# Patient Record
Sex: Female | Born: 1965
Health system: Southern US, Community
[De-identification: ages and names within clinical notes are randomized; demographics above are authoritative.]

## PROBLEM LIST (undated history)

## (undated) DIAGNOSIS — E119 Type 2 diabetes mellitus without complications: Secondary | ICD-10-CM

## (undated) DIAGNOSIS — C801 Malignant (primary) neoplasm, unspecified: Secondary | ICD-10-CM

## (undated) DIAGNOSIS — F419 Anxiety disorder, unspecified: Secondary | ICD-10-CM

## (undated) DIAGNOSIS — K219 Gastro-esophageal reflux disease without esophagitis: Secondary | ICD-10-CM

## (undated) DIAGNOSIS — E079 Disorder of thyroid, unspecified: Secondary | ICD-10-CM

## (undated) DIAGNOSIS — B009 Herpesviral infection, unspecified: Secondary | ICD-10-CM

## (undated) DIAGNOSIS — J45909 Unspecified asthma, uncomplicated: Secondary | ICD-10-CM

## (undated) DIAGNOSIS — F32A Depression, unspecified: Secondary | ICD-10-CM

## (undated) DIAGNOSIS — K635 Polyp of colon: Secondary | ICD-10-CM

## (undated) DIAGNOSIS — G4733 Obstructive sleep apnea (adult) (pediatric): Secondary | ICD-10-CM

## (undated) DIAGNOSIS — E785 Hyperlipidemia, unspecified: Secondary | ICD-10-CM

## (undated) DIAGNOSIS — Z9989 Dependence on other enabling machines and devices: Secondary | ICD-10-CM

## (undated) DIAGNOSIS — F329 Major depressive disorder, single episode, unspecified: Secondary | ICD-10-CM

## (undated) DIAGNOSIS — I10 Essential (primary) hypertension: Secondary | ICD-10-CM

## (undated) HISTORY — PX: SALIVARY GLAND SURGERY: SHX768

## (undated) HISTORY — DX: Dependence on other enabling machines and devices: Z99.89

## (undated) HISTORY — DX: Essential (primary) hypertension: I10

## (undated) HISTORY — DX: Disorder of thyroid, unspecified: E07.9

## (undated) HISTORY — DX: Type 2 diabetes mellitus without complications: E11.9

## (undated) HISTORY — DX: Herpesviral infection, unspecified: B00.9

## (undated) HISTORY — DX: Hyperlipidemia, unspecified: E78.5

## (undated) HISTORY — DX: Gastro-esophageal reflux disease without esophagitis: K21.9

## (undated) HISTORY — PX: TUBAL LIGATION: SHX77

## (undated) HISTORY — DX: Depression, unspecified: F32.A

## (undated) HISTORY — DX: Malignant (primary) neoplasm, unspecified: C80.1

## (undated) HISTORY — DX: Polyp of colon: K63.5

## (undated) HISTORY — DX: Major depressive disorder, single episode, unspecified: F32.9

## (undated) HISTORY — PX: THYROIDECTOMY: SHX17

## (undated) HISTORY — DX: Obstructive sleep apnea (adult) (pediatric): G47.33

## (undated) HISTORY — PX: DILATION AND CURETTAGE OF UTERUS: SHX78

## (undated) HISTORY — DX: Anxiety disorder, unspecified: F41.9

## (undated) HISTORY — DX: Unspecified asthma, uncomplicated: J45.909

## (undated) HISTORY — PX: LEEP: SHX91

---

## 2003-07-19 ENCOUNTER — Encounter: Payer: Self-pay | Admitting: Internal Medicine

## 2003-07-19 ENCOUNTER — Encounter: Admission: RE | Admit: 2003-07-19 | Discharge: 2003-07-19 | Payer: Self-pay | Admitting: Internal Medicine

## 2010-01-02 DIAGNOSIS — C73 Malignant neoplasm of thyroid gland: Secondary | ICD-10-CM | POA: Insufficient documentation

## 2012-04-07 ENCOUNTER — Ambulatory Visit: Payer: Self-pay

## 2012-04-22 ENCOUNTER — Encounter: Payer: Self-pay | Admitting: Internal Medicine

## 2012-04-22 ENCOUNTER — Ambulatory Visit: Payer: Self-pay

## 2012-09-25 DIAGNOSIS — D61818 Other pancytopenia: Secondary | ICD-10-CM | POA: Insufficient documentation

## 2013-01-08 DIAGNOSIS — M4310 Spondylolisthesis, site unspecified: Secondary | ICD-10-CM | POA: Insufficient documentation

## 2013-03-24 DIAGNOSIS — K1120 Sialoadenitis, unspecified: Secondary | ICD-10-CM | POA: Insufficient documentation

## 2014-09-06 DIAGNOSIS — M25521 Pain in right elbow: Secondary | ICD-10-CM | POA: Insufficient documentation

## 2015-12-06 DIAGNOSIS — F321 Major depressive disorder, single episode, moderate: Secondary | ICD-10-CM | POA: Insufficient documentation

## 2016-04-16 DIAGNOSIS — J4520 Mild intermittent asthma, uncomplicated: Secondary | ICD-10-CM | POA: Insufficient documentation

## 2017-12-02 MED FILL — traZODone HCL 100 MG TABS: 100 | 30 days supply | Qty: 30 | Fill #0 | Status: TO

## 2017-12-02 MED FILL — LEVOTHYROXINE 150 MCG TAB: 150 | 90 days supply | Qty: 90 | Fill #0 | Status: TO

## 2017-12-02 MED FILL — HYDROCHLOROTHIAZIDE 25 MG T: 25 | 90 days supply | Qty: 90 | Fill #0 | Status: TO

## 2017-12-02 MED FILL — ENALAPRIL MALEATE 10 MG TAB: 10 | 30 days supply | Qty: 30 | Fill #0

## 2017-12-25 DIAGNOSIS — Z6834 Body mass index (BMI) 34.0-34.9, adult: Secondary | ICD-10-CM | POA: Diagnosis not present

## 2017-12-25 DIAGNOSIS — C73 Malignant neoplasm of thyroid gland: Secondary | ICD-10-CM | POA: Diagnosis not present

## 2017-12-25 DIAGNOSIS — I10 Essential (primary) hypertension: Secondary | ICD-10-CM | POA: Diagnosis not present

## 2017-12-25 DIAGNOSIS — E119 Type 2 diabetes mellitus without complications: Secondary | ICD-10-CM | POA: Diagnosis not present

## 2017-12-26 MED FILL — ENALAPRIL MALEATE 10 MG TAB: 10 | 30 days supply | Qty: 30 | Fill #1 | Status: TO

## 2017-12-26 MED FILL — TRULICITY 0.75 MG/0.5 ML PE: 0.75 | 28 days supply | Qty: 2 | Fill #0 | Status: TO

## 2018-01-01 DIAGNOSIS — Z9289 Personal history of other medical treatment: Secondary | ICD-10-CM | POA: Diagnosis not present

## 2018-01-01 DIAGNOSIS — Z1231 Encounter for screening mammogram for malignant neoplasm of breast: Secondary | ICD-10-CM | POA: Diagnosis not present

## 2018-01-01 DIAGNOSIS — I10 Essential (primary) hypertension: Secondary | ICD-10-CM | POA: Diagnosis not present

## 2018-01-01 DIAGNOSIS — C73 Malignant neoplasm of thyroid gland: Secondary | ICD-10-CM | POA: Diagnosis not present

## 2018-01-01 DIAGNOSIS — E119 Type 2 diabetes mellitus without complications: Secondary | ICD-10-CM | POA: Diagnosis not present

## 2018-03-19 DIAGNOSIS — I10 Essential (primary) hypertension: Secondary | ICD-10-CM | POA: Diagnosis not present

## 2018-03-19 DIAGNOSIS — Z7984 Long term (current) use of oral hypoglycemic drugs: Secondary | ICD-10-CM | POA: Diagnosis not present

## 2018-03-19 DIAGNOSIS — Z79899 Other long term (current) drug therapy: Secondary | ICD-10-CM | POA: Diagnosis not present

## 2018-03-19 DIAGNOSIS — C73 Malignant neoplasm of thyroid gland: Secondary | ICD-10-CM | POA: Diagnosis not present

## 2018-03-19 DIAGNOSIS — K219 Gastro-esophageal reflux disease without esophagitis: Secondary | ICD-10-CM | POA: Diagnosis not present

## 2018-03-19 DIAGNOSIS — E559 Vitamin D deficiency, unspecified: Secondary | ICD-10-CM | POA: Diagnosis not present

## 2018-03-19 DIAGNOSIS — E119 Type 2 diabetes mellitus without complications: Secondary | ICD-10-CM | POA: Diagnosis not present

## 2018-03-19 DIAGNOSIS — J45909 Unspecified asthma, uncomplicated: Secondary | ICD-10-CM | POA: Diagnosis not present

## 2018-05-19 DIAGNOSIS — E119 Type 2 diabetes mellitus without complications: Secondary | ICD-10-CM | POA: Diagnosis not present

## 2018-05-19 DIAGNOSIS — H5213 Myopia, bilateral: Secondary | ICD-10-CM | POA: Diagnosis not present

## 2018-05-20 DIAGNOSIS — M79672 Pain in left foot: Secondary | ICD-10-CM | POA: Diagnosis not present

## 2018-07-24 ENCOUNTER — Ambulatory Visit (INDEPENDENT_AMBULATORY_CARE_PROVIDER_SITE_OTHER): Payer: 59

## 2018-07-24 ENCOUNTER — Ambulatory Visit (INDEPENDENT_AMBULATORY_CARE_PROVIDER_SITE_OTHER): Payer: 59 | Admitting: Internal Medicine

## 2018-07-24 VITALS — BP 120/78 | HR 81 | Temp 98.7°F | Ht 69.0 in | Wt 237.8 lb

## 2018-07-24 DIAGNOSIS — Z86010 Personal history of colonic polyps: Secondary | ICD-10-CM | POA: Diagnosis not present

## 2018-07-24 DIAGNOSIS — M4802 Spinal stenosis, cervical region: Secondary | ICD-10-CM | POA: Diagnosis not present

## 2018-07-24 DIAGNOSIS — M2550 Pain in unspecified joint: Secondary | ICD-10-CM

## 2018-07-24 DIAGNOSIS — I10 Essential (primary) hypertension: Secondary | ICD-10-CM

## 2018-07-24 DIAGNOSIS — F329 Major depressive disorder, single episode, unspecified: Secondary | ICD-10-CM

## 2018-07-24 DIAGNOSIS — G8929 Other chronic pain: Secondary | ICD-10-CM

## 2018-07-24 DIAGNOSIS — M5136 Other intervertebral disc degeneration, lumbar region: Secondary | ICD-10-CM | POA: Diagnosis not present

## 2018-07-24 DIAGNOSIS — M542 Cervicalgia: Secondary | ICD-10-CM | POA: Diagnosis not present

## 2018-07-24 DIAGNOSIS — Z800 Family history of malignant neoplasm of digestive organs: Secondary | ICD-10-CM | POA: Diagnosis not present

## 2018-07-24 DIAGNOSIS — M549 Dorsalgia, unspecified: Secondary | ICD-10-CM | POA: Diagnosis not present

## 2018-07-24 DIAGNOSIS — F419 Anxiety disorder, unspecified: Secondary | ICD-10-CM

## 2018-07-24 DIAGNOSIS — G4733 Obstructive sleep apnea (adult) (pediatric): Secondary | ICD-10-CM | POA: Diagnosis not present

## 2018-07-24 DIAGNOSIS — R232 Flushing: Secondary | ICD-10-CM | POA: Diagnosis not present

## 2018-07-24 DIAGNOSIS — Z113 Encounter for screening for infections with a predominantly sexual mode of transmission: Secondary | ICD-10-CM

## 2018-07-24 DIAGNOSIS — Z85850 Personal history of malignant neoplasm of thyroid: Secondary | ICD-10-CM | POA: Diagnosis not present

## 2018-07-24 DIAGNOSIS — Z9989 Dependence on other enabling machines and devices: Secondary | ICD-10-CM

## 2018-07-24 DIAGNOSIS — M545 Low back pain: Secondary | ICD-10-CM

## 2018-07-24 DIAGNOSIS — E785 Hyperlipidemia, unspecified: Secondary | ICD-10-CM | POA: Diagnosis not present

## 2018-07-24 DIAGNOSIS — E119 Type 2 diabetes mellitus without complications: Secondary | ICD-10-CM

## 2018-07-24 DIAGNOSIS — E559 Vitamin D deficiency, unspecified: Secondary | ICD-10-CM

## 2018-07-24 LAB — POCT URINE PREGNANCY: Preg Test, Ur: NEGATIVE

## 2018-07-24 MED ORDER — VENLAFAXINE HCL ER 37.5 MG PO CP24: 37.5000 mg | ORAL_CAPSULE | Freq: Every day | ORAL | 2 refills | Status: DC

## 2018-07-24 NOTE — Patient Instructions (Addendum)
Consider shingrix vaccine we do it here   Referral in for lung doctor, endocrine Dr. Cruzita Lederer or Dwyane Dee, colonoscopy with [facility]    MRI neck 07/09/12  FINDINGS: C5-C6: There is disk bulging which results in mild central canal stenosis. There is an osteophyte complex resulting in mild left neural foraminal  narrowing.  C6 - C7: There is an osteophyte complex resulting in mild left neuroforaminal  stenosis.  The vertebral bodies are normally aligned.The signal intensity from the  vertebral body bone marrow and spinal cord is normal. No abnormal  enhancement is seen.  IMPRESSION: degenerative changes of the cervical spine as above.  Recombinant Zoster (Shingles) Vaccine, RZV: What You Need to Know 1. Why get vaccinated? Shingles (also called herpes zoster, or just zoster) is a painful skin rash, often with blisters. Shingles is caused by the varicella zoster virus, the same virus that causes chickenpox. After you have chickenpox, the virus stays in your body and can cause shingles later in life. You can't catch shingles from another person. However, a person who has never had chickenpox (or chickenpox vaccine) could get chickenpox from someone with shingles. A shingles rash usually appears on one side of the face or body and heals within 2 to 4 weeks. Its main symptom is pain, which can be severe. Other symptoms can include fever, headache, chills and upset stomach. Very rarely, a shingles infection can lead to pneumonia, hearing problems, blindness, brain inflammation (encephalitis), or death. For about 1 person in 5, severe pain can continue even long after the rash has cleared up. This long-lasting pain is called post-herpetic neuralgia (PHN). Shingles is far more common in people 23 years of age and older than in younger people, and the risk increases with age. It is also more common in people whose immune system is weakened because of a disease such as cancer, or by drugs such as  steroids or chemotherapy. At least 1 million people a year in the [place] get shingles. 2. Shingles vaccine (recombinant) Recombinant shingles vaccine was approved by FDA in 2017 for the prevention of shingles. In clinical trials, it was more than 90% effective in preventing shingles. It can also reduce the likelihood of PHN. Two doses, 2 to 6 months apart, are recommended for adults 35 and older. This vaccine is also recommended for people who have already gotten the live shingles vaccine (Zostavax). There is no live virus in this vaccine. 3. Some people should not get this vaccine Tell your vaccine provider if you:  Have any severe, life-threatening allergies. A person who has ever had a life-threatening allergic reaction after a dose of recombinant shingles vaccine, or has a severe allergy to any component of this vaccine, may be advised not to be vaccinated. Ask your health care provider if you want information about vaccine components.  Are pregnant or breastfeeding. There is not much information about use of recombinant shingles vaccine in pregnant or nursing women. Your healthcare provider might recommend delaying vaccination.  Are not feeling well. If you have a mild illness, such as a cold, you can probably get the vaccine today. If you are moderately or severely ill, you should probably wait until you recover. Your doctor can advise you.  4. Risks of a vaccine reaction With any medicine, including vaccines, there is a chance of reactions. After recombinant shingles vaccination, a person might experience:  Pain, redness, soreness, or swelling at the site of the injection  Headache, muscle aches, fever, shivering, fatigue  In clinical trials, most people got a sore arm with mild or moderate pain after vaccination, and some also had redness and swelling where they got the shot. Some people felt tired, had muscle pain, a headache, shivering, fever, stomach pain, or nausea. About 1  out of 6 people who got recombinant zoster vaccine experienced side effects that prevented them from doing regular activities. Symptoms went away on their own in about 2 to 3 days. Side effects were more common in younger people. You should still get the second dose of recombinant zoster vaccine even if you had one of these reactions after the first dose. Other things that could happen after this vaccine:  People sometimes faint after medical procedures, including vaccination. Sitting or lying down for about 15 minutes can help prevent fainting and injuries caused by a fall. Tell your provider if you feel dizzy or have vision changes or ringing in the ears.  Some people get shoulder pain that can be more severe and longer-lasting than routine soreness that can follow injections. This happens very rarely.  Any medication can cause a severe allergic reaction. Such reactions to a vaccine are estimated at about 1 in a million doses, and would happen within a few minutes to a few hours after the vaccination. As with any medicine, there is a very remote chance of a vaccine causing a serious injury or death. The safety of vaccines is always being monitored. For more information, visit: http://www.aguilar.org/ 5. What if there is a serious problem? What should I look for?  Look for anything that concerns you, such as signs of a severe allergic reaction, very high fever, or unusual behavior. Signs of a severe allergic reaction can include hives, swelling of the face and throat, difficulty breathing, a fast heartbeat, dizziness, and weakness. These would usually start a few minutes to a few hours after the vaccination. What should I do?  If you think it is a severe allergic reaction or other emergency that can't wait, call 9-1-1 and get to the nearest hospital. Otherwise, call your health care provider. Afterward, the reaction should be reported to the Vaccine Adverse Event Reporting System (VAERS). Your  doctor should file this report, or you can do it yourself through the VAERS web site atwww.vaers.https://www.bray.com/ by calling (606) 518-5084. VAERS does not give medical advice. 6. How can I learn more?  Ask your healthcare provider. He or she can give you the vaccine package insert or suggest other sources of information.  Call your local or state health department.  Contact the Centers for Disease Control and Prevention (CDC): ? Call 2084156915 (1-800-CDC-INFO) or ? Visit the CDC's website at http://hunter.com/ CDC Vaccine Information Statement (VIS) Recombinant Zoster Vaccine (12/30/2016) This information is not intended to replace advice given to you by your health care provider. Make sure you discuss any questions you have with your health care provider. Document Released: 01/14/2017 Document Revised: 01/14/2017 Document Reviewed: 01/14/2017 Elsevier Interactive Patient Education  [employer].

## 2018-07-24 NOTE — Progress Notes (Signed)
Chief Complaint  Patient presents with  . Establish Care   New patient multiple complaints  1. C/o knee pain, finger pain and diffuse back pain in spine  2. H/o recurrent thyroid cancer dx'ed in 2011 then returned in 2013 in [place] now on levo 150 mcg qd  3. DM 2 N8G 7.1 on trulicity and metformin [facility] due to [facility] out of network  4. C/o hot flashes, anxiety and depression  5. OSA on cpap due for new sleep study  6. H/o colon polyps and FH colon cancer wants referral for colonoscopy 7. Smoking 1/2 ppd   Review of Systems  Constitutional: Negative for weight loss.  HENT: Negative for hearing loss.   Eyes: Negative for blurred vision.  Respiratory: Negative for shortness of breath.   Cardiovascular: Negative for chest pain.  Gastrointestinal: Negative for abdominal pain.  Genitourinary:       +hot flashes    Musculoskeletal: Positive for back pain and joint pain.  Skin: Negative for rash.  Neurological: Negative for headaches.  Psychiatric/Behavioral: Positive for depression. The patient is nervous/anxious.    Past Medical History:  Diagnosis Date  . Anxiety   . Asthma   . Cancer ([place])    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 saliva production   . Colon polyps   . Depression   . Diabetes mellitus without complication ([place])   . GERD (gastroesophageal reflux disease)   . Hyperlipidemia   . Hypertension   . OSA on CPAP   . Thyroid disease    Past Surgical History:  Procedure Laterality Date  . DILATION AND CURETTAGE OF UTERUS    . LEEP    . SALIVARY GLAND SURGERY     left   . THYROIDECTOMY    . TUBAL LIGATION     Family History  Problem Relation Age of Onset  . Cancer Mother        breast  . Diabetes Mother   . Heart disease Mother        CHF  . Hypertension Mother   . Hyperparathyroidism Mother   . Diabetes Father   . Heart disease Father        CHF  . Hypertension Father   . Thyroid nodules Daughter   .  Hypertension Son        ?  . Stroke Maternal Grandmother   . Cancer Maternal Grandmother        colon   . Cancer Paternal Grandfather        thyroid  . Diabetes Daughter        2   Social History   Socioeconomic History  . Marital status: Legally Separated    Spouse name: Not on file  . Number of children: Not on file  . Years of education: Not on file  . Highest education level: Not on file  Occupational History  . Not on file  Social Needs  . Financial resource strain: Not on file  . Food insecurity:    Worry: Not on file    Inability: Not on file  . Transportation needs:    Medical: Not on file    Non-medical: Not on file  Tobacco Use  . Smoking status: Current Every Day Smoker  . Smokeless tobacco: Never Used  . Tobacco comment: 1/2 ppd   Substance and Sexual Activity  . Alcohol use: Yes  . Drug use: Not Currently  . Sexual activity: Yes  Comment: men  Lifestyle  . Physical activity:    Days per week: Not on file    Minutes per session: Not on file  . Stress: Not on file  Relationships  . Social connections:    Talks on phone: Not on file    Gets together: Not on file    Attends religious service: Not on file    Active member of club or organization: Not on file    Attends meetings of clubs or organizations: Not on file    Relationship status: Not on file  . Intimate partner violence:    Fear of current or ex partner: Not on file    Emotionally abused: Not on file    Physically abused: Not on file    Forced sexual activity: Not on file  Other Topics Concern  . 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 albuterol (PROVENTIL HFA;VENTOLIN HFA) 108 (90 Base) MCG/ACT inhaler Inhale into the lungs.  Marland Kitchen atorvastatin (LIPITOR) 10 MG tablet Take by mouth.  . Blood Glucose Monitoring Suppl (GLUCOCOM BLOOD GLUCOSE MONITOR) DEVI Ok to fill per pt's  insurance formulary  . Dulaglutide 0.75 MG/0.5ML SOPN Inject 0.75 mg into the skin once a week.   . enalapril (VASOTEC) 10 MG tablet Take 10 mg by mouth daily.   Marland Kitchen FREESTYLE LITE test strip   . hydrochlorothiazide (HYDRODIURIL) 25 MG tablet Take 25 mg by mouth daily.   . Lancets (FREESTYLE) lancets   . levothyroxine (SYNTHROID, LEVOTHROID) 150 MCG tablet   . metFORMIN (GLUCOPHAGE) 500 MG tablet Take 500 mg by mouth 2 (two) times daily with a meal.   . omeprazole (PRILOSEC) 40 MG capsule Take by mouth.  . propranolol (INDERAL) 10 MG tablet Take by mouth.  . traZODone (DESYREL) 100 MG tablet Take 100 mg by mouth at bedtime.    No Known Allergies Recent Results (from the past 2160 hour(s))  POCT urine pregnancy     Status: Normal   Collection Time: 07/24/18  2:36 PM  Result Value Ref Range   Preg Test, Ur Negative Negative   Objective  Body mass index is 35.12 kg/m. Wt Readings from Last 3 Encounters:  07/24/18 237 lb 12.8 oz (107.9 kg)   Temp Readings from Last 3 Encounters:  07/24/18 98.7 F (37.1 C) (Oral)   BP Readings from Last 3 Encounters:  07/24/18 120/78   Pulse Readings from Last 3 Encounters:  07/24/18 81    Physical Exam  Constitutional: She is oriented to person, place, and time. Vital signs are normal. She appears well-developed and well-nourished. She is cooperative.  HENT:  Head: Normocephalic and atraumatic.  Mouth/Throat: Oropharynx is clear and moist and mucous membranes are normal.  Eyes: Pupils are equal, round, and reactive to light. Conjunctivae are normal.  Cardiovascular: Normal rate, regular rhythm and normal heart sounds.  Pulmonary/Chest: Effort normal and breath sounds normal.  Neurological: She is alert and oriented to person, place, and time. Gait normal.  Skin: Skin is warm, dry and intact.  Psychiatric: She has a normal mood and affect. Her speech is normal and behavior is normal. Judgment and thought content normal. Cognition and memory are  normal.  Nursing note and vitals reviewed.   Assessment   1. DM 2 A1C 7.1 03/19/18   2. H/o recurrent thyroid cancer x  2 dx'ed 2011 recurrent in lymph nodes in 2013  s/p thyroidectomy with hypothyroidism postsurgical -[place] 01/01/18 neg residual thyriod dz.  3. OSA on cpap needs new cpap  4. Anxiety and depression  5. Hot flashes  6. HTN/HLD 7. HM 8. Back pain and neck pain MRI neck 07/09/12  FINDINGS: C5-C6: There is disk bulging which results in mild central canal stenosis. There is an osteophyte complex resulting in mild left neural foraminal  narrowing.  C6 - C7: There is an osteophyte complex resulting in mild left neuroforaminal  stenosis.  The vertebral bodies are normally aligned.The signal intensity from the  vertebral body bone marrow and spinal cord is normal. No abnormal  enhancement is seen.  IMPRESSION: degenerative changes of the cervical spine as above.  Plan    1 and 2. Currently pt following [facility]  On statin, ACEI On Trulicity 7.25 weekly, metformin 500 mg bid  She is due for PET, MRI/CT per pt to f/u on h/o recurrent thyroid cancer x 2 she will get [facility] to [facility] with [facility] and wants to establish with [facility] due to [facility] out of network  Referred  Cont levo 150 mcg qd  3. Referred pulm needs repeat sleep study  4 and 5. Trial of effexor 37.5 mg qd  6. Cont meds hctz 25 mg qd, enalapril 10 mg qd, propranolol 10 mg qd  7.  Flu shot at work will get  Tdap had 12/08/15  Will check on pna 23 vaccine ? Had 11/18/12  Hep A/B vx had  Disc shingrix vaccine  Per pt check MMR and immune   Due colonoscopy h/o polyps noted 09/18/15 pt wants repeat due to [place] [facility] colon cancer  -referred to GI  Pap at f/u h/o abnormal pap and LEEP Mammogram 01/02/18 neg  Smoker 1/2 ppd rec cessation  8. Xrays neck, mid back and L spine today  rec smoking cessation smoking 1/2 ppd   Provider: Dr. Olivia Mackie McLean-Scocuzza-Internal Medicine

## 2018-07-27 ENCOUNTER — Encounter: Payer: Self-pay | Admitting: Internal Medicine

## 2018-07-27 DIAGNOSIS — F419 Anxiety disorder, unspecified: Secondary | ICD-10-CM

## 2018-07-27 DIAGNOSIS — F32A Depression, unspecified: Secondary | ICD-10-CM | POA: Insufficient documentation

## 2018-07-27 DIAGNOSIS — Z860100 Personal history of colon polyps, unspecified: Secondary | ICD-10-CM | POA: Insufficient documentation

## 2018-07-27 DIAGNOSIS — F329 Major depressive disorder, single episode, unspecified: Secondary | ICD-10-CM | POA: Insufficient documentation

## 2018-07-27 DIAGNOSIS — Z85850 Personal history of malignant neoplasm of thyroid: Secondary | ICD-10-CM | POA: Insufficient documentation

## 2018-07-27 DIAGNOSIS — M549 Dorsalgia, unspecified: Secondary | ICD-10-CM | POA: Insufficient documentation

## 2018-07-27 DIAGNOSIS — Z9989 Dependence on other enabling machines and devices: Secondary | ICD-10-CM

## 2018-07-27 DIAGNOSIS — G8929 Other chronic pain: Secondary | ICD-10-CM | POA: Insufficient documentation

## 2018-07-27 DIAGNOSIS — E1159 Type 2 diabetes mellitus with other circulatory complications: Secondary | ICD-10-CM | POA: Insufficient documentation

## 2018-07-27 DIAGNOSIS — M545 Low back pain: Secondary | ICD-10-CM

## 2018-07-27 DIAGNOSIS — Z800 Family history of malignant neoplasm of digestive organs: Secondary | ICD-10-CM | POA: Insufficient documentation

## 2018-07-27 DIAGNOSIS — E119 Type 2 diabetes mellitus without complications: Secondary | ICD-10-CM | POA: Insufficient documentation

## 2018-07-27 DIAGNOSIS — I10 Essential (primary) hypertension: Secondary | ICD-10-CM | POA: Insufficient documentation

## 2018-07-27 DIAGNOSIS — M542 Cervicalgia: Secondary | ICD-10-CM | POA: Insufficient documentation

## 2018-07-27 DIAGNOSIS — G4733 Obstructive sleep apnea (adult) (pediatric): Secondary | ICD-10-CM | POA: Insufficient documentation

## 2018-07-27 DIAGNOSIS — R232 Flushing: Secondary | ICD-10-CM | POA: Insufficient documentation

## 2018-07-27 DIAGNOSIS — E785 Hyperlipidemia, unspecified: Secondary | ICD-10-CM | POA: Insufficient documentation

## 2018-07-27 NOTE — Addendum Note (Signed)
Addended by: Orland Mustard on: 07/27/2018 05:28 PM   Modules accepted: Orders

## 2018-07-28 ENCOUNTER — Telehealth: Payer: Self-pay

## 2018-07-28 NOTE — Telephone Encounter (Signed)
LVM for pt to call office regarding colonoscopy referral.  Thanks Sharyn Lull

## 2018-07-29 ENCOUNTER — Telehealth: Payer: Self-pay

## 2018-07-29 NOTE — Telephone Encounter (Signed)
Copied from [place] [id number]. Topic: General - Other >> Jul 29, 2018  3:30 PM Yvette Rack wrote: Reason for CRM: pt calling stating that Dr Aundra Dubin was going to send in a RX for Flexeril to her pharmacy and when she went to go pick it up it wasn't there

## 2018-07-30 ENCOUNTER — Ambulatory Visit (INDEPENDENT_AMBULATORY_CARE_PROVIDER_SITE_OTHER): Payer: 59 | Admitting: Internal Medicine

## 2018-07-30 ENCOUNTER — Encounter: Payer: Self-pay | Admitting: Internal Medicine

## 2018-07-30 ENCOUNTER — Other Ambulatory Visit: Payer: Self-pay | Admitting: Internal Medicine

## 2018-07-30 VITALS — BP 124/86 | HR 52 | Resp 16 | Ht 66.0 in | Wt 246.0 lb

## 2018-07-30 DIAGNOSIS — G4733 Obstructive sleep apnea (adult) (pediatric): Secondary | ICD-10-CM

## 2018-07-30 DIAGNOSIS — M542 Cervicalgia: Secondary | ICD-10-CM

## 2018-07-30 DIAGNOSIS — G8929 Other chronic pain: Secondary | ICD-10-CM

## 2018-07-30 DIAGNOSIS — M549 Dorsalgia, unspecified: Secondary | ICD-10-CM

## 2018-07-30 DIAGNOSIS — Z9989 Dependence on other enabling machines and devices: Secondary | ICD-10-CM

## 2018-07-30 MED ORDER — CYCLOBENZAPRINE HCL 5 MG PO TABS: 5.0000 mg | ORAL_TABLET | Freq: Every evening | ORAL | 1 refills | Status: DC | PRN

## 2018-07-30 NOTE — Progress Notes (Signed)
[facility] Consultation      Assessment and Plan:  Obstructive sleep apnea Excessive daytime sleepiness - Not currently using CPAP, patient has recurrent symptoms of daytime sleepiness, difficulty concentrating. - We discussed starting on a new auto CPAP machine, she prefers not to do this due to the payments involved.  I have therefore given her prescription for new CPAP supplies to restart using her current machine.  We will also need to do a download on her current machine to see if her current settings are adequate and adjust them as necessary.  Essential hypertension, diabetes mellitus. - Obstructive sleep apnea can contribute to above conditions, therefore treatment of sleep apnea is an important part of their management.  Orders Placed This Encounter  Procedures  . Ambulatory Referral for DME   Return in about 3 months (around 10/29/2018).   Date: 07/30/2018  MRN# [id number] Kerry Davis April 19, 1966    Kerry Davis is a 52 y.o. old female seen in consultation for chief complaint of:    Chief Complaint  Patient presents with  . Consult    Referred by Dr. Karlyn Agee Scocuzza:management of OSA.    HPI:   The patient is a 52 year old female with a history of obstructive sleep apnea.  Patient typically goes to bed at 8 AM, she wakes up several times after taking about 15 minutes to fall asleep.  She usually gets out of bed at 4:30 PM.  Her Epworth score is elevated at 14 today.  She has been working 3rd shift for about 3 years, she continues to feel tired while at work.  She was diagnosed with OSA around 2010 due to snoring, witnessed apneas, sleepiness, fatigue and trouble concentrating.  Her symptoms improved after starting on CPAP. About 2 years ago she developed issues with her mask, but she didn ot get a new one, she eventually stopped using it. She still has the CPAP but she rarely uses it, only when she feels "extra tired". She is feeling  very tired and feels that her sleep is no longer refreshing, she again has trouble concentrating. She does not know what her machine is set at.      PMHX:   Past Medical History:  Diagnosis Date  . Anxiety   . Asthma   . 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 saliva production   . Colon polyps   . Depression   . Diabetes mellitus without complication ([place])   . GERD (gastroesophageal reflux disease)   . Hyperlipidemia   . Hypertension   . OSA on CPAP   . Thyroid disease    Surgical Hx:  Past Surgical History:  Procedure Laterality Date  . DILATION AND CURETTAGE OF UTERUS    . LEEP    . SALIVARY GLAND SURGERY     left   . THYROIDECTOMY    . TUBAL LIGATION     Family Hx:  Family History  Problem Relation Age of Onset  . Cancer Mother        breast  . Diabetes Mother   . Heart disease Mother        CHF  . Hypertension Mother   . Hyperparathyroidism Mother   . Diabetes Father   . Heart disease Father        CHF  . Hypertension Father   . Thyroid nodules Daughter   . Hypertension Son        ?  .  Stroke Maternal Grandmother   . Cancer Maternal Grandmother        colon   . Cancer Paternal Grandfather        thyroid  . Diabetes Daughter        2   Social Hx:   Social History   Tobacco Use  . Smoking status: Current Every Day Smoker  . Smokeless tobacco: Never Used  . Tobacco comment: 1/2 ppd   Substance Use Topics  . Alcohol use: Yes  . Drug use: Not Currently   Medication:    Current Outpatient Medications:  .  albuterol (PROVENTIL HFA;VENTOLIN HFA) 108 (90 Base) MCG/ACT inhaler, Inhale into the lungs., Disp: , Rfl:  .  atorvastatin (LIPITOR) 10 MG tablet, Take by mouth., Disp: , Rfl:  .  Blood Glucose Monitoring Suppl ([place]) DEVI, Ok to fill per [employer] formulary, Disp: , Rfl:  .  cyclobenzaprine (FLEXERIL) 5 MG tablet, Take 1 tablet (5 mg total) by mouth at bedtime as needed for muscle  spasms., Disp: 30 tablet, Rfl: 1 .  Dulaglutide 0.75 MG/0.5ML SOPN, Inject 0.75 mg into the skin once a week. , Disp: , Rfl:  .  enalapril (VASOTEC) 10 MG tablet, Take 10 mg by mouth daily. , Disp: , Rfl:  .  FREESTYLE LITE test strip, , Disp: , Rfl: 3 .  hydrochlorothiazide (HYDRODIURIL) 25 MG tablet, Take 25 mg by mouth daily. , Disp: , Rfl: 2 .  Lancets (FREESTYLE) lancets, , Disp: , Rfl: 3 .  levothyroxine (SYNTHROID, LEVOTHROID) 150 MCG tablet, , Disp: , Rfl: 0 .  metFORMIN (GLUCOPHAGE) 500 MG tablet, Take 500 mg by mouth 2 (two) times daily with a meal. , Disp: , Rfl:  .  omeprazole (PRILOSEC) 40 MG capsule, Take by mouth., Disp: , Rfl:  .  propranolol (INDERAL) 10 MG tablet, Take by mouth., Disp: , Rfl:  .  traZODone (DESYREL) 100 MG tablet, Take 100 mg by mouth at bedtime. , Disp: , Rfl: 3 .  venlafaxine XR (EFFEXOR XR) 37.5 MG 24 hr capsule, Take 1 capsule (37.5 mg total) by mouth daily with breakfast., Disp: 30 capsule, Rfl: 2   Allergies:  Patient has no known allergies.  Review of Systems: Gen:  Denies  fever, sweats, chills HEENT: Denies blurred vision, double vision. bleeds, sore throat Cvc:  No dizziness, chest pain. Resp:   Denies cough or sputum production, shortness of breath Gi: Denies swallowing difficulty, stomach pain. Gu:  Denies bladder incontinence, burning urine Ext:   No Joint pain, stiffness. Skin: No skin rash,  hives  Endoc:  No polyuria, polydipsia. Psych: No depression, insomnia. Other:  All other systems were reviewed with the patient and were negative other that what is mentioned in the HPI.   Physical Examination:   VS: BP 124/86 (BP Location: Left Arm, Cuff Size: Large)   Pulse (!) 52   Resp 16   Ht 5\' 6"  (1.676 m)   Wt 246 lb (111.6 kg)   SpO2 95%   BMI 39.71 kg/m   General Appearance: No distress  Neuro:without focal findings,  speech normal,  HEENT: PERRLA, EOM intact.   Pulmonary: normal breath sounds, No wheezing.    CardiovascularNormal S1,S2.  No m/r/g.   Abdomen: Benign, Soft, non-tender. Renal:  No costovertebral tenderness  GU:  No performed at this time. Endoc: No evident thyromegaly, no signs of acromegaly. Skin:   warm, no rashes, no ecchymosis  Extremities: normal, no cyanosis, clubbing.  Other  findings:    LABORATORY PANEL:   CBC No results for input(s): WBC, HGB, HCT, PLT in the last 168 hours. ------------------------------------------------------------------------------------------------------------------  Chemistries  No results for input(s): NA, K, CL, CO2, GLUCOSE, BUN, CREATININE, CALCIUM, MG, AST, ALT, ALKPHOS, BILITOT in the last 168 hours.  Invalid input(s): GFRCGP ------------------------------------------------------------------------------------------------------------------  Cardiac Enzymes No results for input(s): TROPONINI in the last 168 hours. ------------------------------------------------------------  RADIOLOGY:  No results found.     Thank  you for the consultation and for allowing [facility] to assist in the care of your patient. Our recommendations are noted above.  Please contact us if we can be of further service.   Marda Stalker, M.D., F.C.C.P.  Board Certified in Internal Medicine, Pulmonary Medicine, [place], and Sleep Medicine.  [facility] Office Number: [phone]   07/30/2018

## 2018-07-30 NOTE — Patient Instructions (Addendum)
Will prescribe a new mask to start using your current cpap, will see if we can get download information from your current CPAP.    Sleep Apnea    Sleep apnea is disorder that affects a person's sleep. A person with sleep apnea has abnormal pauses in their breathing when they sleep. It is hard for them to get a good sleep. This makes a person tired during the day. It also can lead to other physical problems. There are three types of sleep apnea. One type is when breathing stops for a short time because your airway is blocked (obstructive sleep apnea). Another type is when the brain sometimes fails to give the normal signal to breathe to the muscles that control your breathing (central sleep apnea). The third type is a combination of the other two types.  HOME CARE   Take all medicine as told by your doctor.  Avoid alcohol, calming medicines (sedatives), and depressant drugs.  Try to lose weight if you are overweight. Talk to your doctor about a healthy weight goal.  Your doctor may have you use a device that helps to open your airway. It can help you get the air that you need. It is called a positive airway pressure (PAP) device.   MAKE SURE YOU:   Understand these instructions.  Will watch your condition.  Will get help right away if you are not doing well or get worse.  It may take approximately 1 month for you to get used to wearing her CPAP every night.  Be sure to work with your machine to get used to it, be patient, it may take time!  If you have trouble tolerating CPAP DO NOT RETURN YOUR MACHINE; Contact our office to see if we can help you tolerate the CPAP better first!

## 2018-07-31 ENCOUNTER — Other Ambulatory Visit (HOSPITAL_COMMUNITY)
Admission: RE | Admit: 2018-07-31 | Discharge: 2018-07-31 | Disposition: A | Discharge status: Home / Self Care | Payer: 59 | Source: Ambulatory Visit | Attending: Internal Medicine | Admitting: Internal Medicine

## 2018-07-31 ENCOUNTER — Other Ambulatory Visit (INDEPENDENT_AMBULATORY_CARE_PROVIDER_SITE_OTHER): Payer: 59

## 2018-07-31 ENCOUNTER — Other Ambulatory Visit: Payer: Self-pay | Admitting: Internal Medicine

## 2018-07-31 DIAGNOSIS — Z113 Encounter for screening for infections with a predominantly sexual mode of transmission: Secondary | ICD-10-CM | POA: Diagnosis not present

## 2018-07-31 DIAGNOSIS — E559 Vitamin D deficiency, unspecified: Secondary | ICD-10-CM | POA: Insufficient documentation

## 2018-07-31 DIAGNOSIS — E119 Type 2 diabetes mellitus without complications: Secondary | ICD-10-CM

## 2018-07-31 DIAGNOSIS — E785 Hyperlipidemia, unspecified: Secondary | ICD-10-CM | POA: Diagnosis not present

## 2018-07-31 DIAGNOSIS — R232 Flushing: Secondary | ICD-10-CM | POA: Diagnosis not present

## 2018-07-31 DIAGNOSIS — M2550 Pain in unspecified joint: Secondary | ICD-10-CM

## 2018-07-31 LAB — CBC WITH DIFFERENTIAL/PLATELET
BASOS PCT: 0.9 % (ref 0.0–3.0)
Basophils Absolute: 0.1 10*3/uL (ref 0.0–0.1)
EOS PCT: 2.6 % (ref 0.0–5.0)
Eosinophils Absolute: 0.2 10*3/uL (ref 0.0–0.7)
HCT: 35.8 % — ABNORMAL LOW (ref 36.0–46.0)
Hemoglobin: 11.9 g/dL — ABNORMAL LOW (ref 12.0–15.0)
LYMPHS ABS: 2.7 10*3/uL (ref 0.7–4.0)
Lymphocytes Relative: 45.8 % (ref 12.0–46.0)
MCHC: 33.2 g/dL (ref 30.0–36.0)
MCV: 85.1 fl (ref 78.0–100.0)
MONOS PCT: 4.9 % (ref 3.0–12.0)
Monocytes Absolute: 0.3 10*3/uL (ref 0.1–1.0)
NEUTROS ABS: 2.7 10*3/uL (ref 1.4–7.7)
NEUTROS PCT: 45.8 % (ref 43.0–77.0)
PLATELETS: 282 10*3/uL (ref 150.0–400.0)
RBC: 4.2 Mil/uL (ref 3.87–5.11)
RDW: 14.9 % (ref 11.5–15.5)
WBC: 6 10*3/uL (ref 4.0–10.5)

## 2018-07-31 LAB — COMPREHENSIVE METABOLIC PANEL
ALK PHOS: 54 U/L (ref 39–117)
ALT: 26 U/L (ref 0–35)
AST: 17 U/L (ref 0–37)
Albumin: 4.1 g/dL (ref 3.5–5.2)
BUN: 16 mg/dL (ref 6–23)
CO2: 27 meq/L (ref 19–32)
Calcium: 8.9 mg/dL (ref 8.4–10.5)
Chloride: 101 mEq/L (ref 96–112)
Creatinine, Ser: 0.99 mg/dL (ref 0.40–1.20)
GFR: 75.94 mL/min (ref >60.00)
GLUCOSE: 95 mg/dL (ref 70–99)
POTASSIUM: 3.3 meq/L — AB (ref 3.5–5.1)
Sodium: 138 mEq/L (ref 135–145)
TOTAL PROTEIN: 6.9 g/dL (ref 6.0–8.3)
Total Bilirubin: 0.3 mg/dL (ref 0.2–1.2)

## 2018-07-31 LAB — LIPID PANEL
Cholesterol: 183 mg/dL (ref 0–200)
HDL: 44.7 mg/dL (ref >39.00)
LDL Cholesterol: 125 mg/dL — ABNORMAL HIGH (ref 0–99)
NonHDL: 138.14
TRIGLYCERIDES: 68 mg/dL (ref 0.0–149.0)
Total CHOL/HDL Ratio: 4
VLDL: 13.6 mg/dL (ref 0.0–40.0)

## 2018-07-31 LAB — C-REACTIVE PROTEIN: CRP: 0.3 mg/dL — ABNORMAL LOW (ref 0.5–20.0)

## 2018-07-31 LAB — FOLLICLE STIMULATING HORMONE: FSH: 54.9 m[IU]/mL

## 2018-07-31 LAB — VITAMIN D 25 HYDROXY (VIT D DEFICIENCY, FRACTURES): VITD: 22.9 ng/mL — AB (ref 30.00–100.00)

## 2018-07-31 LAB — HEMOGLOBIN A1C: HEMOGLOBIN A1C: 6.8 % — AB (ref 4.6–6.5)

## 2018-07-31 LAB — SEDIMENTATION RATE: Sed Rate: 51 mm/hr — ABNORMAL HIGH (ref 0–30)

## 2018-07-31 MED ORDER — CHOLECALCIFEROL 1.25 MG (50000 UT) PO CAPS: 50000.0000 [IU] | ORAL_CAPSULE | ORAL | 1 refills | Status: DC

## 2018-08-01 LAB — URINALYSIS, ROUTINE W REFLEX MICROSCOPIC
Bilirubin Urine: NEGATIVE
Glucose, UA: NEGATIVE
Hgb urine dipstick: NEGATIVE
Ketones, ur: NEGATIVE
LEUKOCYTES UA: NEGATIVE
Nitrite: NEGATIVE
PROTEIN: NEGATIVE
SPECIFIC GRAVITY, URINE: 1.009 (ref 1.001–1.03)
pH: <=5 (ref 5.0–8.0)

## 2018-08-01 LAB — MICROALBUMIN / CREATININE URINE RATIO
Creatinine, Urine: 37 mg/dL (ref 20–275)
Microalb, Ur: <0.2 mg/dL

## 2018-08-03 ENCOUNTER — Telehealth: Payer: Self-pay | Admitting: Internal Medicine

## 2018-08-03 LAB — URINE CYTOLOGY ANCILLARY ONLY
CHLAMYDIA, DNA PROBE: NEGATIVE
Neisseria Gonorrhea: NEGATIVE
TRICH (WINDOWPATH): NEGATIVE

## 2018-08-03 NOTE — Telephone Encounter (Signed)
Patient calling us back with CPAP machine information she has She has Res med  She states Suanne Marker needed some more numbers of the machine and reading but would like a call back for she was not sure which numbers we needed.    04/22/2012 was the Last sleep study here at [facility]

## 2018-08-03 NOTE — Telephone Encounter (Signed)
Pt states that paperwork that she has indicates that she received CPAP Machine from [facility].  Pt states that she will bring a copy of Sleep Study so we can scan into Epic.  Order for CPAP Supplies faxed to [facility]. Rhonda J Cobb

## 2018-08-05 LAB — HEPATITIS C ANTIBODY
Hepatitis C Ab: NONREACTIVE
SIGNAL TO CUT-OFF: 0.01 (ref <1.00)

## 2018-08-05 LAB — HEPATITIS B SURFACE ANTIBODY, QUANTITATIVE: Hepatitis B-Post: 185 m[IU]/mL (ref >=10)

## 2018-08-05 LAB — ANA: Anti Nuclear Antibody(ANA): NEGATIVE

## 2018-08-05 LAB — HSV 1 ANTIBODY, IGG: HSV 1 GLYCOPROTEIN G AB, IGG: 26.7 {index} — AB

## 2018-08-05 LAB — RPR: RPR: NONREACTIVE

## 2018-08-05 LAB — CYCLIC CITRUL PEPTIDE ANTIBODY, IGG: Cyclic Citrullin Peptide Ab: <16 UNITS

## 2018-08-05 LAB — HSV 2 ANTIBODY, IGG: HSV 2 Glycoprotein G Ab, IgG: 10.7 index — ABNORMAL HIGH

## 2018-08-05 LAB — HIV ANTIBODY (ROUTINE TESTING W REFLEX): HIV 1&2 Ab, 4th Generation: NONREACTIVE

## 2018-08-05 LAB — HEPATITIS B SURFACE ANTIGEN: Hepatitis B Surface Ag: NONREACTIVE

## 2018-08-05 LAB — RHEUMATOID FACTOR

## 2018-08-06 LAB — URINE CYTOLOGY ANCILLARY ONLY

## 2018-08-07 ENCOUNTER — Other Ambulatory Visit: Payer: Self-pay | Admitting: Internal Medicine

## 2018-08-07 DIAGNOSIS — N760 Acute vaginitis: Principal | ICD-10-CM

## 2018-08-07 DIAGNOSIS — B9689 Other specified bacterial agents as the cause of diseases classified elsewhere: Secondary | ICD-10-CM

## 2018-08-07 MED ORDER — METRONIDAZOLE 500 MG PO TABS: 500.0000 mg | ORAL_TABLET | Freq: Two times a day (BID) | ORAL | 0 refills | Status: DC

## 2018-08-10 ENCOUNTER — Other Ambulatory Visit: Payer: Self-pay

## 2018-08-10 DIAGNOSIS — Z86010 Personal history of colonic polyps: Secondary | ICD-10-CM

## 2018-08-10 DIAGNOSIS — G4733 Obstructive sleep apnea (adult) (pediatric): Secondary | ICD-10-CM | POA: Diagnosis not present

## 2018-08-10 DIAGNOSIS — Z800 Family history of malignant neoplasm of digestive organs: Secondary | ICD-10-CM

## 2018-08-25 ENCOUNTER — Other Ambulatory Visit (HOSPITAL_COMMUNITY)
Admission: RE | Admit: 2018-08-25 | Discharge: 2018-08-25 | Disposition: A | Discharge status: Home / Self Care | Payer: 59 | Source: Ambulatory Visit | Attending: Internal Medicine | Admitting: Internal Medicine

## 2018-08-25 ENCOUNTER — Ambulatory Visit (INDEPENDENT_AMBULATORY_CARE_PROVIDER_SITE_OTHER): Payer: 59 | Admitting: Internal Medicine

## 2018-08-25 ENCOUNTER — Encounter: Payer: Self-pay | Admitting: Internal Medicine

## 2018-08-25 ENCOUNTER — Other Ambulatory Visit: Payer: Self-pay

## 2018-08-25 ENCOUNTER — Ambulatory Visit: Payer: 59 | Attending: Internal Medicine

## 2018-08-25 VITALS — BP 130/80 | HR 89 | Temp 98.3°F | Ht 66.0 in | Wt 238.8 lb

## 2018-08-25 DIAGNOSIS — E785 Hyperlipidemia, unspecified: Secondary | ICD-10-CM | POA: Diagnosis not present

## 2018-08-25 DIAGNOSIS — M545 Low back pain: Secondary | ICD-10-CM | POA: Diagnosis not present

## 2018-08-25 DIAGNOSIS — M542 Cervicalgia: Secondary | ICD-10-CM | POA: Insufficient documentation

## 2018-08-25 DIAGNOSIS — R232 Flushing: Secondary | ICD-10-CM | POA: Diagnosis not present

## 2018-08-25 DIAGNOSIS — I10 Essential (primary) hypertension: Secondary | ICD-10-CM | POA: Diagnosis not present

## 2018-08-25 DIAGNOSIS — Z23 Encounter for immunization: Secondary | ICD-10-CM

## 2018-08-25 DIAGNOSIS — E119 Type 2 diabetes mellitus without complications: Secondary | ICD-10-CM

## 2018-08-25 DIAGNOSIS — Z1231 Encounter for screening mammogram for malignant neoplasm of breast: Secondary | ICD-10-CM | POA: Diagnosis not present

## 2018-08-25 DIAGNOSIS — E559 Vitamin D deficiency, unspecified: Secondary | ICD-10-CM

## 2018-08-25 DIAGNOSIS — N760 Acute vaginitis: Secondary | ICD-10-CM

## 2018-08-25 DIAGNOSIS — G8929 Other chronic pain: Secondary | ICD-10-CM | POA: Diagnosis not present

## 2018-08-25 DIAGNOSIS — Z124 Encounter for screening for malignant neoplasm of cervix: Secondary | ICD-10-CM

## 2018-08-25 DIAGNOSIS — Z85850 Personal history of malignant neoplasm of thyroid: Secondary | ICD-10-CM

## 2018-08-25 DIAGNOSIS — F419 Anxiety disorder, unspecified: Secondary | ICD-10-CM

## 2018-08-25 DIAGNOSIS — M6281 Muscle weakness (generalized): Secondary | ICD-10-CM | POA: Insufficient documentation

## 2018-08-25 DIAGNOSIS — B9689 Other specified bacterial agents as the cause of diseases classified elsewhere: Secondary | ICD-10-CM

## 2018-08-25 DIAGNOSIS — F32A Depression, unspecified: Secondary | ICD-10-CM

## 2018-08-25 DIAGNOSIS — Z780 Asymptomatic menopausal state: Secondary | ICD-10-CM

## 2018-08-25 DIAGNOSIS — F329 Major depressive disorder, single episode, unspecified: Secondary | ICD-10-CM

## 2018-08-25 MED ORDER — METRONIDAZOLE 500 MG PO TABS: 500.0000 mg | ORAL_TABLET | Freq: Two times a day (BID) | ORAL | 0 refills | Status: DC

## 2018-08-25 MED ORDER — ALPRAZOLAM 1 MG PO TABS: 1.0000 mg | ORAL_TABLET | Freq: Every day | ORAL | 5 refills | Status: DC | PRN

## 2018-08-25 MED ORDER — ATORVASTATIN CALCIUM 10 MG PO TABS: 10.0000 mg | ORAL_TABLET | Freq: Every day | ORAL | 3 refills | Status: DC

## 2018-08-25 MED ORDER — VENLAFAXINE HCL ER 75 MG PO CP24: 75.0000 mg | ORAL_CAPSULE | Freq: Every day | ORAL | 1 refills | Status: DC

## 2018-08-25 MED ORDER — CHOLECALCIFEROL 1.25 MG (50000 UT) PO CAPS: 50000.0000 [IU] | ORAL_CAPSULE | ORAL | 1 refills | Status: DC

## 2018-08-25 NOTE — Patient Instructions (Addendum)
rec Aspirin 81 mg daily  Please take lipitor 10 mg at night  Call to schedule mammogram due 01/02/2019  replens is an over the counter topical medication which restores vaginal moisture    Pneumococcal Polysaccharide Vaccine: What You Need to Know 1. Why get vaccinated? Vaccination can protect older adults (and some children and younger adults) from pneumococcal disease. Pneumococcal disease is caused by bacteria that can spread from person to person through close contact. It can cause ear infections, and it can also lead to more serious infections of the:  Lungs (pneumonia),  Blood (bacteremia), and  Covering of the brain and spinal cord (meningitis). Meningitis can cause deafness and brain damage, and it can be fatal.  Anyone can get pneumococcal disease, but children under 72 years of age, people with certain medical conditions, adults over 64 years of age, and cigarette smokers are at the highest risk. About 18,000 older adults die each year from pneumococcal disease in the [place]. Treatment of pneumococcal infections with penicillin and other drugs used to be more effective. But some strains of the disease have become resistant to these drugs. This makes prevention of the disease, through vaccination, even more important. 2. Pneumococcal polysaccharide vaccine (PPSV23) Pneumococcal polysaccharide vaccine (PPSV23) protects against 23 types of pneumococcal bacteria. It will not prevent all pneumococcal disease. PPSV23 is recommended for:  All adults 51 years of age and older,  Anyone 2 through 51 years of age with certain long-term health problems,  Anyone 2 through 51 years of age with a weakened immune system,  Adults 51 through 51 years of age who smoke cigarettes or have asthma.  Most people need only one dose of PPSV. A second dose is recommended for certain high-risk groups. People 51 and older should get a dose even if they have gotten one or more doses of the vaccine  before they turned 65. Your healthcare provider can give you more information about these recommendations. Most healthy adults develop protection within 2 to 3 weeks of getting the shot. 3. Some people should not get this vaccine  Anyone who has had a life-threatening allergic reaction to PPSV should not get another dose.  Anyone who has a severe allergy to any component of PPSV should not receive it. Tell your provider if you have any severe allergies.  Anyone who is moderately or severely ill when the shot is scheduled may be asked to wait until they recover before getting the vaccine. Someone with a mild illness can usually be vaccinated.  Children less than 44 years of age should not receive this vaccine.  There is no evidence that PPSV is harmful to either a pregnant woman or to her fetus. However, as a precaution, women who need the vaccine should be vaccinated before becoming pregnant, if possible. 4. Risks of a vaccine reaction With any medicine, including vaccines, there is a chance of side effects. These are usually mild and go away on their own, but serious reactions are also possible. About half of people who get PPSV have mild side effects, such as redness or pain where the shot is given, which go away within about two days. Less than 1 out of 100 people develop a fever, muscle aches, or more severe local reactions. Problems that could happen after any vaccine:  People sometimes faint after a medical procedure, including vaccination. Sitting or lying down for about 15 minutes can help prevent fainting, and injuries caused by a fall. Tell your doctor if you feel  dizzy, or have vision changes or ringing in the ears.  Some people get severe pain in the shoulder and have difficulty moving the arm where a shot was given. This happens very rarely.  Any medication can cause a severe allergic reaction. Such reactions from a vaccine are very rare, estimated at about 1 in a million doses,  and would happen within a few minutes to a few hours after the vaccination. As with any medicine, there is a very remote chance of a vaccine causing a serious injury or death. The safety of vaccines is always being monitored. For more information, visit: http://www.aguilar.org/ 5. What if there is a serious reaction? What should I look for? Look for anything that concerns you, such as signs of a severe allergic reaction, very high fever, or unusual behavior. Signs of a severe allergic reaction can include hives, swelling of the face and throat, difficulty breathing, a fast heartbeat, dizziness, and weakness. These would usually start a few minutes to a few hours after the vaccination. What should I do? If you think it is a severe allergic reaction or other emergency that can't wait, call 9-1-1 or get to the nearest hospital. Otherwise, call your doctor. Afterward, the reaction should be reported to the Vaccine Adverse Event Reporting System (VAERS). Your doctor might file this report, or you can do it yourself through the VAERS web site at www.vaers.SamedayNews.es, or by calling [phone]. VAERS does not give medical advice. 6. How can I learn more?  Ask your doctor. He or she can give you the vaccine package insert or suggest other sources of information.  Call your local or state health department.  Contact the Centers for Disease Control and Prevention (CDC): ? Call (217)671-4591 (1-800-CDC-INFO) or ? Visit CDC's website at http://hunter.com/ CDC Pneumococcal Polysaccharide Vaccine VIS (03/11/14) This information is not intended to replace advice given to you by your health care provider. Make sure you discuss any questions you have with your health care provider. Document Released: 09/01/2006 Document Revised: 07/25/2016 Document Reviewed: 07/25/2016 Elsevier Interactive Patient Education  2017 [employer].

## 2018-08-25 NOTE — Patient Instructions (Signed)
  Quadriceps Set (Prone)   With toes supporting lower legs, squeeze rear end muscles and tighten thigh muscles to straighten knees. Hold _5___ seconds. Relax. Repeat __10__ times per set. Do ___3_ sets per session. Do ___1_ sessions per day.  http://orth.exer.us/726   Copyright  VHI. All rights reserved.

## 2018-08-25 NOTE — Progress Notes (Signed)
Chief Complaint  Patient presents with  . Follow-up    pap   F/u pap  1. C/o anxiety,insomnia, depression, hot flashes effexor 37.5 not helping  2. Reviewed labs low vit D, HLD did not pick up meds ASCVD risk score 21.2, +BV did not pick up meds  3. DM2 A1C 6.8   Review of Systems  Constitutional: Negative for weight loss.  HENT: Negative for hearing loss.   Eyes: Negative for blurred vision.  Respiratory: Negative for shortness of breath.   Cardiovascular: Negative for chest pain.  Gastrointestinal: Negative for abdominal pain.  Genitourinary:       +hot flashes   Musculoskeletal: Positive for back pain and joint pain.  Skin: Negative for rash.  Neurological: Negative for headaches.  Psychiatric/Behavioral: Positive for depression. The patient is nervous/anxious and has insomnia.    Past Medical History:  Diagnosis Date  . Anxiety   . Asthma   . 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 Colon polyps   . Depression   . Diabetes mellitus without complication ([place])   . GERD (gastroesophageal reflux disease)   . Hyperlipidemia   . Hypertension   . OSA on CPAP   . Thyroid disease    Past Surgical History:  Procedure Laterality Date  . DILATION AND CURETTAGE OF UTERUS    . LEEP    . SALIVARY GLAND SURGERY     left   . THYROIDECTOMY    . TUBAL LIGATION     Family History  Problem Relation Age of Onset  . Cancer Mother        breast  . Diabetes Mother   . Heart disease Mother        CHF  . Hypertension Mother   . Hyperparathyroidism Mother   . Diabetes Father   . Heart disease Father        CHF  . Hypertension Father   . Thyroid nodules Daughter   . Hypertension Son        ?  . Stroke Maternal Grandmother   . Cancer Maternal Grandmother        colon   . Cancer Paternal Grandfather        thyroid  . Diabetes Daughter        2   Social History   Socioeconomic History  . Marital status: Legally Separated     Spouse name: Not on file  . Number of children: Not on file  . Years of education: Not on file  . Highest education level: Not on file  Occupational History  . Not on file  Social Needs  . Financial resource strain: Not on file  . Food insecurity:    Worry: Not on file    Inability: Not on file  . Transportation needs:    Medical: Not on file    Non-medical: Not on file  Tobacco Use  . Smoking status: Current Every Day Smoker  . Smokeless tobacco: Never Used  . Tobacco comment: 1/2 ppd   Substance and Sexual Activity  . Alcohol use: Yes  . Drug use: Not Currently  . Sexual activity: Yes    Comment: men  Lifestyle  . Physical activity:    Days per week: Not on file    Minutes per session: Not on file  . Stress: Not on file  Relationships  . Social connections:    Talks on phone: Not on file  Gets together: Not on file    Attends religious service: Not on file    Active member of club or organization: Not on file    Attends meetings of clubs or organizations: Not on file    Relationship status: Not on file  . Intimate partner violence:    Fear of current or ex partner: Not on file    Emotionally abused: Not on file    Physically abused: Not on file    Forced sexual activity: Not on file  Other Topics Concern  . 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 albuterol (PROVENTIL HFA;VENTOLIN HFA) 108 (90 Base) MCG/ACT inhaler Inhale into the lungs.  Marland Kitchen atorvastatin (LIPITOR) 10 MG tablet Take by mouth.  . Blood Glucose Monitoring Suppl (GLUCOCOM BLOOD GLUCOSE MONITOR) DEVI Ok to fill per pt's insurance formulary  . Cholecalciferol 50000 units capsule Take 1 capsule (50,000 Units total) by mouth once a week.  . cyclobenzaprine (FLEXERIL) 5 MG tablet Take 1 tablet (5 mg total) by mouth at bedtime as needed for muscle spasms.  . Dulaglutide 0.75 MG/0.5ML SOPN  Inject 0.75 mg into the skin once a week.   . enalapril (VASOTEC) 10 MG tablet Take 10 mg by mouth daily.   Marland Kitchen FREESTYLE LITE test strip   . hydrochlorothiazide (HYDRODIURIL) 25 MG tablet Take 25 mg by mouth daily.   . Lancets (FREESTYLE) lancets   . levothyroxine (SYNTHROID, LEVOTHROID) 150 MCG tablet   . metFORMIN (GLUCOPHAGE) 500 MG tablet Take 500 mg by mouth 2 (two) times daily with a meal.   . metroNIDAZOLE (FLAGYL) 500 MG tablet Take 1 tablet (500 mg total) by mouth 2 (two) times daily. With food  . omeprazole (PRILOSEC) 40 MG capsule Take by mouth.  . propranolol (INDERAL) 10 MG tablet Take by mouth.  . traZODone (DESYREL) 100 MG tablet Take 100 mg by mouth at bedtime.   Marland Kitchen venlafaxine XR (EFFEXOR XR) 37.5 MG 24 hr capsule Take 1 capsule (37.5 mg total) by mouth daily with breakfast.   No Known Allergies Recent Results (from the past 2160 hour(s))  POCT urine pregnancy     Status: Normal   Collection Time: 07/24/18  2:36 PM  Result Value Ref Range   Preg Test, Ur Negative Negative  Urine cytology ancillary only     Status: None   Collection Time: 07/31/18 12:00 AM  Result Value Ref Range   Chlamydia Negative     Comment: Normal Reference Range - Negative   Neisseria gonorrhea Negative     Comment: Normal Reference Range - Negative   Trichomonas Negative     Comment: Normal Reference Range - Negative  Urine cytology ancillary only     Status: Abnormal   Collection Time: 07/31/18 12:00 AM  Result Value Ref Range   Bacterial vaginitis (A)     **POSITIVE for Atopobium vaginae, POSITIVE for Megasphaera 1, POSITIVE for Gardnerella vaginalis, POSITIVE for BVAB2**    Comment: Normal Reference Range - Negative  HSV 2 antibody, IgG     Status: Abnormal   Collection Time: 07/31/18  9:04 AM  Result Value Ref Range   HSV 2 Glycoprotein G Ab, IgG 10.70 (H) index    Comment:  Index          Interpretation                           -----          --------------                            <0.90          Negative                           0.90-1.09      Equivocal                           >1.09          Positive . This assay utilizes recombinant type-specific antigens to differentiate HSV-1 from HSV-2 infections. A positive result cannot distinguish between recent and past infection. If recent HSV infection is suspected but the results are negative or equivocal, the assay should be repeated in 4-6 weeks. The performance characteristics of the assay have not been established for pediatric populations, immunocompromised patients, or neonatal screening.   HSV 1 antibody, IgG     Status: Abnormal   Collection Time: 07/31/18  9:04 AM  Result Value Ref Range   HSV 1 Glycoprotein G Ab, IgG 26.70 (H) index    Comment:                           Index          Interpretation                           -----          --------------                           <0.90          Negative                           0.90-1.09      Equivocal                           >1.09          Positive . This assay utilizes recombinant type-specific antigens to differentiate HSV-1 from HSV-2 infections. A positive result cannot distinguish between recent and past infection. If recent HSV infection is suspected but the results are negative or equivocal, the assay should be repeated in 4-6 weeks. The performance characteristics of the assay have not been established for pediatric populations, immunocompromised patients, or neonatal screening.   Hepatitis B surface antigen     Status: None   Collection Time: 07/31/18  9:04 AM  Result Value Ref Range   Hepatitis B Surface Ag NON-REACTIVE NON-REACTI  Hepatitis B surface antibody,quantitative     Status: None   Collection Time: 07/31/18  9:04 AM  Result Value Ref Range   Hepatitis B-Post 185 > OR = 10 mIU/mL    Comment: . Patient has immunity to hepatitis B virus. . For additional information, please refer  to http://education.[facility].com/faq/FAQ105 (This link is being provided for informational/ educational purposes only).  Hepatitis C antibody     Status: None   Collection Time: 07/31/18  9:04 AM  Result Value Ref Range   Hepatitis C Ab NON-REACTIVE NON-REACTI   SIGNAL TO CUT-OFF 0.01 <1.00    Comment: . HCV antibody was non-reactive. There is no laboratory  evidence of HCV infection. . In most cases, no further action is required. However, if recent HCV exposure is suspected, a test for HCV RNA (test code [phone]) is suggested. . For additional information please refer to http://education.[facility].com/faq/FAQ22v1 (This link is being provided for informational/ educational purposes only.) .   RPR     Status: None   Collection Time: 07/31/18  9:04 AM  Result Value Ref Range   RPR Ser Ql NON-REACTIVE NON-REACTI  HIV antibody (with reflex)     Status: None   Collection Time: 07/31/18  9:04 AM  Result Value Ref Range   HIV 1&2 Ab, 4th Generation NON-REACTIVE NON-REACTI    Comment: HIV-1 antigen and HIV-1/HIV-2 antibodies were not detected. There is no laboratory evidence of HIV infection. Marland Kitchen PLEASE NOTE: This information has been disclosed to you from records whose confidentiality may be protected by state law.  If your state requires such protection, then the state law prohibits you from making any further disclosure of the information without the specific written consent of the person to whom it pertains, or as otherwise permitted by law. A general authorization for the release of medical or other information is NOT sufficient for this purpose. . For additional information please refer to http://education.[facility].com/faq/FAQ106 (This link is being provided for informational/ educational purposes only.) . Marland Kitchen The performance of this assay has not been clinically validated in patients less than 6 years old. .   Cyclic citrul peptide antibody,  IgG     Status: None   Collection Time: 07/31/18  9:04 AM  Result Value Ref Range   Cyclic Citrullin Peptide Ab <16 UNITS    Comment: Reference Range Negative:            <20 Weak Positive:       20-39 Moderate Positive:   40-59 Strong Positive:     >59 .   C-reactive protein     Status: Abnormal   Collection Time: 07/31/18  9:04 AM  Result Value Ref Range   CRP 0.3 (L) 0.5 - 20.0 mg/dL  Sedimentation rate     Status: Abnormal   Collection Time: 07/31/18  9:04 AM  Result Value Ref Range   Sed Rate 51 (H) 0 - 30 mm/hr  Antinuclear Antib (ANA)     Status: None   Collection Time: 07/31/18  9:04 AM  Result Value Ref Range   Anti Nuclear Antibody(ANA) NEGATIVE NEGATIVE    Comment: ANA IFA is a first line screen for detecting the presence of up to approximately 150 autoantibodies in various autoimmune diseases. A negative ANA IFA result suggests an ANA-associated autoimmune disease is not present at this time, but is not definitive. If there is high clinical suspicion for Sjogren's syndrome, testing for anti-SS-A/Ro antibody should be considered. Anti-Jo-1 antibody should be considered for clinically suspected inflammatory myopathies. . AC-0: Negative . International Consensus on ANA Patterns (https://www.hernandez-brewer.com/) . For additional information, please refer to http://education.[facility].com/faq/FAQ177 (This link is being provided for informational/ educational purposes only.) .   Rheumatoid Factor     Status: None   Collection Time: 07/31/18  9:04 AM  Result Value Ref Range   Rhuematoid fact SerPl-aCnc <14 <14 IU/mL  [facility]  Status: None   Collection Time: 07/31/18  9:04 AM  Result Value Ref Range   FSH 54.9 mIU/ML    Comment: Female Reference Range:  1.4-18.1 mIU/mLFemale Reference Range:Follicular Phase          2.5-10.2 mIU/mLMidCycle Peak          3.4-33.4 mIU/mLLuteal Phase          1.5-9.1 mIU/mLPost Menopausal     23.0-116.3 mIU/mLPregnant           <0.3 mIU/mL  Vitamin D (25 hydroxy)     Status: Abnormal   Collection Time: 07/31/18  9:04 AM  Result Value Ref Range   VITD 22.90 (L) 30.00 - 100.00 ng/mL  Urine Microalbumin w/creat. ratio     Status: None   Collection Time: 07/31/18  9:04 AM  Result Value Ref Range   Creatinine, Urine 37 20 - 275 mg/dL   Microalb, Ur <0.2 mg/dL    Comment: Reference Range Not established    Microalb Creat Ratio NOTE <30 mcg/mg creat    Comment: The microalbumin value is less than 0.2 mg/dL therefore we are unable to calculate excretion and/or creatinine ratio. . . The ADA defines abnormalities in albumin excretion as follows: Marland Kitchen Category         Result (mcg/mg creatinine) . Normal                    <30 Microalbuminuria         30-299  Clinical albuminuria   > OR = 300 . The ADA recommends that at least two of three specimens collected within a 3-6 month period be abnormal before considering a patient to be within a diagnostic category.   Urinalysis, Routine w reflex microscopic     Status: None   Collection Time: 07/31/18  9:04 AM  Result Value Ref Range   Color, Urine YELLOW YELLOW   APPearance CLEAR CLEAR   Specific Gravity, Urine 1.009 1.001 - 1.03   pH < OR = 5.0 5.0 - 8.0   Glucose, UA NEGATIVE NEGATIVE   Bilirubin Urine NEGATIVE NEGATIVE   Ketones, ur NEGATIVE NEGATIVE   Hgb urine dipstick NEGATIVE NEGATIVE   Protein, ur NEGATIVE NEGATIVE   Nitrite NEGATIVE NEGATIVE   Leukocytes, UA NEGATIVE NEGATIVE  Hemoglobin A1c     Status: Abnormal   Collection Time: 07/31/18  9:04 AM  Result Value Ref Range   Hgb A1c MFr Bld 6.8 (H) 4.6 - 6.5 %    Comment: Glycemic Control Guidelines for People with Diabetes:Non Diabetic:  <6%Goal of Therapy: <7%Additional Action Suggested:  >8%   Lipid panel     Status: Abnormal   Collection Time: 07/31/18  9:04 AM  Result Value Ref Range   Cholesterol 183 0 - 200 mg/dL    Comment: ATP III Classification       Desirable:  < 200 mg/dL                Borderline High:  200 - 239 mg/dL          High:  > = 240 mg/dL   Triglycerides 68.0 0.0 - 149.0 mg/dL    Comment: Normal:  <150 mg/dLBorderline High:  150 - 199 mg/dL   HDL 44.70 >39.00 mg/dL   VLDL 13.6 0.0 - 40.0 mg/dL   LDL Cholesterol 125 (H) 0 - 99 mg/dL   Total CHOL/HDL Ratio 4     Comment:  Men          Women1/2 Average Risk     3.4          3.3Average Risk          5.0          4.42X Average Risk          9.6          7.13X Average Risk          15.0          11.0                       NonHDL 138.14     Comment: NOTE:  Non-HDL goal should be 30 mg/dL higher than patient's LDL goal (i.e. LDL goal of < 70 mg/dL, would have non-HDL goal of < 100 mg/dL)  CBC with Differential/Platelet     Status: Abnormal   Collection Time: 07/31/18  9:04 AM  Result Value Ref Range   WBC 6.0 4.0 - 10.5 K/uL   RBC 4.20 3.87 - 5.11 Mil/uL   Hemoglobin 11.9 (L) 12.0 - 15.0 g/dL   HCT 35.8 (L) 36.0 - 46.0 %   MCV 85.1 78.0 - 100.0 fl   MCHC 33.2 30.0 - 36.0 g/dL   RDW 14.9 11.5 - 15.5 %   Platelets 282.0 150.0 - 400.0 K/uL   Neutrophils Relative % 45.8 43.0 - 77.0 %   Lymphocytes Relative 45.8 12.0 - 46.0 %   Monocytes Relative 4.9 3.0 - 12.0 %   Eosinophils Relative 2.6 0.0 - 5.0 %   Basophils Relative 0.9 0.0 - 3.0 %   Neutro Abs 2.7 1.4 - 7.7 K/uL   Lymphs Abs 2.7 0.7 - 4.0 K/uL   Monocytes Absolute 0.3 0.1 - 1.0 K/uL   Eosinophils Absolute 0.2 0.0 - 0.7 K/uL   Basophils Absolute 0.1 0.0 - 0.1 K/uL  Comprehensive metabolic panel     Status: Abnormal   Collection Time: 07/31/18  9:04 AM  Result Value Ref Range   Sodium 138 135 - 145 mEq/L   Potassium 3.3 (L) 3.5 - 5.1 mEq/L   Chloride 101 96 - 112 mEq/L   CO2 27 19 - 32 mEq/L   Glucose, Bld 95 70 - 99 mg/dL   BUN 16 6 - 23 mg/dL   Creatinine, Ser 0.99 0.40 - 1.20 mg/dL   Total Bilirubin 0.3 0.2 - 1.2 mg/dL   Alkaline Phosphatase 54 39 - 117 U/L   AST 17 0 - 37 U/L   ALT 26 0 - 35 U/L   Total Protein 6.9 6.0 - 8.3  g/dL   Albumin 4.1 3.5 - 5.2 g/dL   Calcium 8.9 8.4 - 10.5 mg/dL   GFR 75.94 >60.00 mL/min   Objective  Body mass index is 38.54 kg/m. Wt Readings from Last 3 Encounters:  08/25/18 238 lb 12.8 oz (108.3 kg)  07/30/18 246 lb (111.6 kg)  07/24/18 237 lb 12.8 oz (107.9 kg)   Temp Readings from Last 3 Encounters:  08/25/18 98.3 F (36.8 C) (Oral)  07/24/18 98.7 F (37.1 C) (Oral)   BP Readings from Last 3 Encounters:  08/25/18 130/80  07/30/18 124/86  07/24/18 120/78   Pulse Readings from Last 3 Encounters:  08/25/18 89  07/30/18 (!) 52  07/24/18 81    Physical Exam  Constitutional: She is oriented to person, place, and time. Vital signs are normal. She appears well-developed and well-nourished. She is cooperative.  HENT:  Head: Normocephalic and atraumatic.  Mouth/Throat: Oropharynx is clear and moist and mucous membranes are normal.  Eyes: Pupils are equal, round, and reactive to light. Conjunctivae are normal.  Cardiovascular: Normal rate, regular rhythm and normal heart sounds.  Pulmonary/Chest: Effort normal and breath sounds normal.  Genitourinary: Vagina normal and uterus normal. Pelvic exam was performed with patient supine. There is no rash or lesion on the right labia. There is no rash or lesion on the left labia. Cervix exhibits discharge and friability. Cervix exhibits no motion tenderness. Right adnexum displays no mass, no tenderness and no fullness. Left adnexum displays no mass, no tenderness and no fullness.  Genitourinary Comments: White discharge on exam   Neurological: She is alert and oriented to person, place, and time. Gait normal.  Skin: Skin is warm, dry and intact.  Psychiatric: She has a normal mood and affect. Her speech is normal and behavior is normal. Judgment and thought content normal. Cognition and memory are normal.  Vitals reviewed.   Assessment   1. Anxiety/depression/insomnia. Hot flashes  2. Vitamin D def  3. HLD/HTN BP controlled   4. DM 2 A1C 6.8 and h/o thyroid cancer  5. HM Plan  1. Increase effexor 75 mg qd  F/u in 3-4 months  Advise take xanax 1 mg qhs prn was on prior  2. rec D3 50K IU weekly x  19month  3. rec take lipitor 10 mg qhs and cont meds  Monitor  4. F/u with endocrine tried to make appt today again  5.  Flu shot had 07/2018 Tdap had 12/08/15  Will check on pna 23 vaccine ? Had 11/18/12  -pna 23 given today  Hep A/B vx had hep B immune  Disc shingrix vaccine  Per pt check MMR and immune   Due colonoscopy h/o polyps noted 09/18/15 pt wants repeat due to [place] cancer  -referred to GI saw GI already  Pap at f/u h/o abnormal pap and LEEP -pap today  Mammogram 01/02/18 neg referred today  Smoker 1/2 ppd rec cessation  Provider: Dr. TOlivia MackieMcLean-Scocuzza-Internal Medicine

## 2018-08-25 NOTE — Therapy (Signed)
[facility] [address], [place], [address] Phone: [phone]   Fax:  [phone]  Physical Therapy Evaluation  Patient Details  Name: Kerry Davis MRN: [id number] Date of Birth: 1966-01-23 Referring Provider (PT): Orland Mustard, MD   Encounter Date: 08/25/2018  PT End of Session - 08/25/18 0807    Visit Number  1    Number of Visits  17    Date for PT Re-Evaluation  10/22/18    PT Start Time  0807    PT Stop Time  0930    PT Time Calculation (min)  83 min    Activity Tolerance  Patient tolerated treatment well    Behavior During Therapy  [facility] for tasks assessed/performed       Past Medical History:  Diagnosis Date  . Anxiety   . Asthma   . 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 Colon polyps   . Depression   . Diabetes mellitus without complication ([place])   . GERD (gastroesophageal reflux disease)   . Herpes    1+2  . Hyperlipidemia   . Hypertension   . OSA on CPAP   . Thyroid disease     Past Surgical History:  Procedure Laterality Date  . DILATION AND CURETTAGE OF UTERUS    . LEEP    . SALIVARY GLAND SURGERY     left   . THYROIDECTOMY    . TUBAL LIGATION      There were no vitals filed for this visit.   Subjective Assessment - 08/25/18 0816    Subjective  Neck pain (posterior cervical paraspinal area): Stiffness currently, no pain (pt sitting), 5/10 neck pain at most for the past 3 months; Back (R low back > L): 4/10 currently (pt sitting on chair with back support) and at best, 10/10 at worst for the past 3 months. Has not yet had PT for her neck and back.  Back pain is worse than neck pain.     Pertinent History  Neck and back pain. 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 Neck pain occured initially due to her thyroid cancer in 2011. Thyroid was removed and underwent radiation 2x. 2013 was the last radiation  treatment.  Neck pain located bilateral posterior cervical paraspinal area.  Had an x-ray for her neck which revealed degenerative disc disease.  Feels numbness along her R 3-5 digits as well.  Pt is R hand dominant.  Only neck surgery was her thyroid removal (anterior surgical incisions).   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  Had x-rays performed for her back which revealed worsened degenerative disc disease. Denies tingling or numbness from the waist down.  Denies loss of bowel or bladder control.  No back surgeries.  Feels like her bones are squished together.     Patient Stated Goals  Want to be able to run a 5k, be pain free. Be able to exercise.     Currently in Pain?  Yes    Pain Score  4     Pain Location  Back   also has neck pain   Pain Orientation  Right;Posterior    Pain Type  Chronic pain    Pain Onset  More than a month ago    Pain Frequency  Constant    Aggravating Factors   Neck: looking at the computer, looking down and  turning her head to the R > L, not supporting her neck; Back: Standing 5 minutes (prior to leaning), Lifting patients, bending over, squatting down.     Pain Relieving Factors  neck: holding her head up with her hand/s, supporting her head with pillows; Back: ibuproven, massaging, prone position with hip flexion on either LE (prone with LE in a semi figure 4 position).          [facility] PT Assessment - 08/25/18 0839      Assessment   Medical Diagnosis  Cervicalgia, back pain    Referring Provider (PT)  Orland Mustard, MD    Onset Date/Surgical Date  07/30/18   date PT referral signed; chronic condition   Hand Dominance  Right    Prior Therapy  None      Precautions   Precaution Comments  Hx or thyroid CA      Restrictions   Other Position/Activity Restrictions  no known weight bearing restrictions      Prior Function   Vocation  Full time employment   nurse, night shift   Vocation Requirements  PLOF: less difficulty working at the computer, looking  around, performing standing tasks, lifting patients    Leisure  hike      Observation/Other Assessments   Observations  (+) repeated flexion test      Posture/Postural Control   Posture Comments  R cervical side bend, protracted neck, bilaterally protracted shoulders, R shoulder higher than L, movement crease around C4/5 and C6/7 area.   Bilateral foot pronation.       AROM   Cervical Flexion  limited with pain R posterior cervical paraspinal muscle around C4 area   L cervical side bend around C4/5 area.    Cervical Extension  WFL    Cervical - Right Side Bend  limited with pain around L C4 transverse process area    Cervical - Left Side Bend  limited with pain around L C4 transverse process area    Cervical - Right Rotation  55    Cervical - Left Rotation  60   L lower cervical stress feeling, needing support   Lumbar Flexion  WFL    abberant movement on the way back up to neutral.    Lumbar Extension  linmited with R trunk rotation. Pain along the thoracolumbar area.     Lumbar - Right Side Bend  limited with reproduction or R low back pain    Lumbar - Left Side Bend  limited with L low back pain    Lumbar - Right Rotation  WFL with R low back symptoms    performed in sitting   Lumbar - Left Rotation  WFL   performed in sitting     Strength   Right Shoulder Flexion  4/5    Right Shoulder ABduction  4/5    Left Shoulder Flexion  4/5    Left Shoulder ABduction  4/5    Right Elbow Flexion  4-/5    Right Elbow Extension  4/5    Left Elbow Flexion  4/5    Left Elbow Extension  4/5    Right Wrist Extension  4-/5    Left Wrist Extension  4-/5    Right Hip Flexion  4-/5    Right Hip Extension  3-/5    Right Hip ABduction  4/5    Left Hip Flexion  4-/5    Left Hip Extension  3+/5    Left Hip ABduction  4-/5  Right Knee Flexion  4/5    Right Knee Extension  4+/5    Left Knee Flexion  4/5    Left Knee Extension  4+/5      Palpation   Palpation comment  tense R upper trap  muscle; tense bilateral lumbar paraspinal muscles (upper part). Tension R quadratus lumborum.       Ambulation/Gait   Gait Comments  increased L trunk rotation during L LE swing phase, L trunk side bend during L LE stance phase.                 Objective measurements completed on examination: See above findings.   Diabetes and blood pressure controlled per pt.    Blood pressure, L arm sitting, manually taken 140/88   Tense bilateral lumbar paraspinal muscles, upper part.  Tense R upper trap muscle   Hx of thyroid CA     Unable to obtain FOTO score today secondary to pt needing to go to another appointment after session.   Manual therapy  Prone STM to R lumbar paraspinal muscle area  and R quadratus lumborum   Decreased back pain afterwards  Therapeutic exercise  Prone glute max set 10x5 seconds each LE   Reviewed and given as part of her HEP. Pt demonstrated and verbalized understanding. Handout provided.   Improved exercise technique, movement at target joints, use of target muscles after mod verbal, visual, tactile cues.   Decreased back pain after treatment.    Patient is a 52 year old female who came to physical therapy secondary to chronic neck and back pain. She also presents with R hand paresthesia, UE and LE weakness, poor posture, muscle tension (cervical and lumbar paraspinals, upper traps, quadratus lumborum), and difficulty performing functional tasks such as looking around, working at the computer for documentation, as well as lifting patients, and bending over due to her symptoms. Patient will benefit from skilled physical therapy services to address the aforementioned deficits.      PT Education - 08/25/18 1012    Education Details  ther-ex, HEP, plan of care    Person(s) Educated  Patient    Methods  Explanation;Demonstration;Tactile cues;Verbal cues;Handout    Comprehension  Returned demonstration;Verbalized understanding       PT Short  Term Goals - 08/25/18 1003      PT SHORT TERM GOAL #1   Title  Patient will be independent with her HEP to decrease neck and back pain and improve function.     Baseline  Patient has started her HEP (08/25/2018)    Time  3    Period  Weeks    Status  New    Target Date  09/17/18        PT Long Term Goals - 08/25/18 1004      PT LONG TERM GOAL #1   Title  Patient will have a decrease in neck pain to 2/10 or less to promote ability to look around, work at her computer more comfortably.     Baseline  5/10 neck pain at most for the past 3 months (08/25/2018)    Time  8    Period  Weeks    Status  New    Target Date  10/22/18      PT LONG TERM GOAL #2   Title  Patient will have a decrease in back pain to 5/10 or less at most to promote ability to perform tasks at work such as lifting patients more comfortably.  Baseline  10/10 back pain at worst for the past 3 months (08/25/2018)    Time  8    Period  Weeks    Status  New    Target Date  10/22/18      PT LONG TERM GOAL #3   Title  Patient will improve bilateral hip extension and abduction strength by at least 1/2 MMT grade to promote ability to perform standing tasks more comfortably.     Time  8    Period  Weeks    Status  New    Target Date  10/22/18      PT LONG TERM GOAL #4   Title  Patient will improve standing tolerance to 15 minutes or more to promote ability to perform standing tasks at work.     Baseline  5 minutes standing prior to having to lean (08/25/2018)    Time  8    Period  Weeks    Status  New    Target Date  10/22/18             Plan - 08/25/18 0955    Clinical Impression Statement  Patient is a 52 year old female who came to physical therapy secondary to chronic neck and back pain. She also presents with R hand paresthesia, UE and LE weakness, poor posture, muscle tension (cervical and lumbar paraspinals, upper traps, quadratus lumborum), and difficulty performing functional tasks such as looking  around, working at the computer for documentation, as well as lifting patients, and bending over due to her symptoms. Patient will benefit from skilled physical therapy services to address the aforementioned deficits.     History and Personal Factors relevant to plan of care:  neck and back pain, chronicity of condition, hx of CA, difficulty looking around, working at the computer, lifting patients for work, bending over    Clinical Presentation  Evolving    Clinical Presentation due to:  symptoms seem to have worsened overall based on subjective reports.     Clinical Decision Making  Low    Rehab Potential  Fair    Clinical Impairments Affecting Rehab Potential  (-) Chronicity of condition, multiple areas of pain and symptoms, weakness, pain focus; (+) motivated    PT Frequency  2x / week    PT Duration  8 weeks    PT Treatment/Interventions  Therapeutic activities;Therapeutic exercise;Neuromuscular re-education;Patient/family education;Manual techniques;Dry needling;Aquatic Therapy;Electrical Stimulation;Iontophoresis 4mg /ml Dexamethasone    PT Next Visit Plan  anterior cervical strengthening, posture, scapular strengthening, thoracic extension, trunk strengthening, glute med and max strengthening, manual techniques, modalities PRN    Consulted and Agree with Plan of Care  Patient       Patient will benefit from skilled therapeutic intervention in order to improve the following deficits and impairments:  Decreased strength, Difficulty walking, Improper body mechanics, Postural dysfunction, Pain  Visit Diagnosis: Chronic low back pain, unspecified back pain laterality, unspecified whether sciatica present - Plan: PT plan of care cert/re-cert  Cervicalgia - Plan: PT plan of care cert/re-cert  Muscle weakness (generalized) - Plan: PT plan of care cert/re-cert     Problem List Patient Active Problem List   Diagnosis Date Noted  . Menopause 08/25/2018  . Vitamin D deficiency 07/31/2018   . Type 2 diabetes mellitus without complication, without long-term current use of insulin ([place]) 07/27/2018  . History of thyroid cancer 07/27/2018  . Family history of colon cancer 07/27/2018  . History of colon polyps 07/27/2018  . OSA on CPAP  07/27/2018  . Hyperlipidemia 07/27/2018  . Anxiety and depression 07/27/2018  . Hot flashes 07/27/2018  . Chronic low back pain 07/27/2018  . Mid back pain 07/27/2018  . Cervicalgia 07/27/2018  . Essential hypertension 07/27/2018     Joneen Boers PT, DPT  08/25/2018, 8:18 PM  [facility] [address], [place], [address] Phone: [phone]   Fax:  [phone]  Name: Kerry Davis MRN: [id number] Date of Birth: 1966-10-09

## 2018-08-25 NOTE — Progress Notes (Signed)
Pre visit review using our clinic review tool, if applicable. No additional management support is needed unless otherwise documented below in the visit note. 

## 2018-08-26 LAB — CYTOLOGY - PAP
Diagnosis: NEGATIVE
HPV: NOT DETECTED

## 2018-08-27 ENCOUNTER — Ambulatory Visit: Payer: 59

## 2018-08-27 DIAGNOSIS — M545 Low back pain: Secondary | ICD-10-CM | POA: Diagnosis not present

## 2018-08-27 DIAGNOSIS — M542 Cervicalgia: Secondary | ICD-10-CM | POA: Diagnosis not present

## 2018-08-27 DIAGNOSIS — M6281 Muscle weakness (generalized): Secondary | ICD-10-CM | POA: Diagnosis not present

## 2018-08-27 DIAGNOSIS — G8929 Other chronic pain: Secondary | ICD-10-CM | POA: Diagnosis not present

## 2018-08-27 NOTE — Therapy (Signed)
[facility] [address], [place], [address] Phone: [phone]   Fax:  [phone]  Physical Therapy Treatment  Patient Details  Name: Kerry Davis MRN: [id number] Date of Birth: 1966/08/08 Referring Provider (PT): Orland Mustard, MD   Encounter Date: 08/27/2018  PT End of Session - 08/27/18 0906    Visit Number  2    Number of Visits  17    Date for PT Re-Evaluation  10/22/18    PT Start Time  0906    PT Stop Time  0946    PT Time Calculation (min)  40 min    Activity Tolerance  Patient tolerated treatment well    Behavior During Therapy  [facility] for tasks assessed/performed       Past Medical History:  Diagnosis Date  . Anxiety   . Asthma   . 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 Colon polyps   . Depression   . Diabetes mellitus without complication ([place])   . GERD (gastroesophageal reflux disease)   . Herpes    1+2  . Hyperlipidemia   . Hypertension   . OSA on CPAP   . Thyroid disease     Past Surgical History:  Procedure Laterality Date  . DILATION AND CURETTAGE OF UTERUS    . LEEP    . SALIVARY GLAND SURGERY     left   . THYROIDECTOMY    . TUBAL LIGATION      There were no vitals filed for this visit.  Subjective Assessment - 08/27/18 0907    Subjective  Back is hurting a little bit right now. 4/10 currently (back). Felt good after last session. Neck is 4/10 currently. Wants to work on her neck today.     Pertinent History  Neck and back pain. 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 Neck pain occured initially due to her thyroid cancer in 2011. Thyroid was removed and underwent radiation 2x. 2013 was the last radiation treatment.  Neck pain located bilateral posterior cervical paraspinal area.  Had an x-ray for her neck which revealed degenerative disc disease.  Feels numbness along her R 3-5 digits as well.  Pt  is R hand dominant.  Only neck surgery was her thyroid removal (anterior surgical incisions).   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  Had x-rays performed for her back which revealed worsened degenerative disc disease. Denies tingling or numbness from the waist down.  Denies loss of bowel or bladder control.  No back surgeries.  Feels like her bones are squished together.     Patient Stated Goals  Want to be able to run a 5k, be pain free. Be able to exercise.     Currently in Pain?  Yes    Pain Score  4     Pain Onset  More than a month ago                               PT Education - 08/27/18 0949    Education Details  ther-ex, HEP    Person(s) Educated  Patient    Methods  Explanation;Demonstration;Tactile cues;Verbal cues;Handout    Comprehension  Returned demonstration;Verbalized understanding        Objective   Diabetes and blood pressure controlled per pt.    No latex band  allergies  Tense bilateral lumbar paraspinal muscles, upper part.  Tense R upper trap muscle  Hx of thyroid CA   Manual therapy  Seated STM R upper trap muscle   Seated STM R cervical paraspinal muscle    Decreased neck pain  Therapeutic exercise   Supine with head on deflated ball   Cervical nodding 1 min x 3  Cervical rotation (crepitus free range) 1 min x 3   Chin tucks 10x5 seconds for 3 sets    Reviewed and given chin tucks as part of her HEP. Pt demonstrated and verbalized understanding,    Neck feels better after aforementioned exercises.   Standing bilateral scapular retraction resisting red band 10x3 with 5 second holds  Improved exercise technique, movement at target joints, use of target muscles after mod verbal, visual, tactile cues.      Decreased neck pain following treatment to decrease posterior cervical paraspinal muscle tension. Worked on anterior cervical muscle and scapular muscle strengthening to promote ability to perform activities  without having to use her hands to support her neck. Pt will benefit from continued skilled physical therapy services to decrease neck and back pain and improve function.      PT Short Term Goals - 08/25/18 1003      PT SHORT TERM GOAL #1   Title  Patient will be independent with her HEP to decrease neck and back pain and improve function.     Baseline  Patient has started her HEP (08/25/2018)    Time  3    Period  Weeks    Status  New    Target Date  09/17/18        PT Long Term Goals - 08/25/18 1004      PT LONG TERM GOAL #1   Title  Patient will have a decrease in neck pain to 2/10 or less to promote ability to look around, work at her computer more comfortably.     Baseline  5/10 neck pain at most for the past 3 months (08/25/2018)    Time  8    Period  Weeks    Status  New    Target Date  10/22/18      PT LONG TERM GOAL #2   Title  Patient will have a decrease in back pain to 5/10 or less at most to promote ability to perform tasks at work such as lifting patients more comfortably.     Baseline  10/10 back pain at worst for the past 3 months (08/25/2018)    Time  8    Period  Weeks    Status  New    Target Date  10/22/18      PT LONG TERM GOAL #3   Title  Patient will improve bilateral hip extension and abduction strength by at least 1/2 MMT grade to promote ability to perform standing tasks more comfortably.     Time  8    Period  Weeks    Status  New    Target Date  10/22/18      PT LONG TERM GOAL #4   Title  Patient will improve standing tolerance to 15 minutes or more to promote ability to perform standing tasks at work.     Baseline  5 minutes standing prior to having to lean (08/25/2018)    Time  8    Period  Weeks    Status  New    Target Date  10/22/18  Plan - 08/27/18 0909    Clinical Impression Statement  Decreased neck pain following treatment to decrease posterior cervical paraspinal muscle tension. Worked on anterior cervical muscle  and scapular muscle strengthening to promote ability to perform activities without having to use her hands to support her neck. Pt will benefit from continued skilled physical therapy services to decrease neck and back pain and improve function.     Rehab Potential  Fair    Clinical Impairments Affecting Rehab Potential  (-) Chronicity of condition, multiple areas of pain and symptoms, weakness, pain focus; (+) motivated    PT Frequency  2x / week    PT Duration  8 weeks    PT Treatment/Interventions  Therapeutic activities;Therapeutic exercise;Neuromuscular re-education;Patient/family education;Manual techniques;Dry needling;Aquatic Therapy;Electrical Stimulation;Iontophoresis 4mg /ml Dexamethasone    PT Next Visit Plan  anterior cervical strengthening, posture, scapular strengthening, thoracic extension, trunk strengthening, glute med and max strengthening, manual techniques, modalities PRN    Consulted and Agree with Plan of Care  Patient       Patient will benefit from skilled therapeutic intervention in order to improve the following deficits and impairments:  Decreased strength, Difficulty walking, Improper body mechanics, Postural dysfunction, Pain  Visit Diagnosis: Cervicalgia  Muscle weakness (generalized)     Problem List Patient Active Problem List   Diagnosis Date Noted  . Menopause 08/25/2018  . Vitamin D deficiency 07/31/2018  . Type 2 diabetes mellitus without complication, without long-term current use of insulin ([place]) 07/27/2018  . History of thyroid cancer 07/27/2018  . Family history of colon cancer 07/27/2018  . History of colon polyps 07/27/2018  . OSA on CPAP 07/27/2018  . Hyperlipidemia 07/27/2018  . Anxiety and depression 07/27/2018  . Hot flashes 07/27/2018  . Chronic low back pain 07/27/2018  . Mid back pain 07/27/2018  . Cervicalgia 07/27/2018  . Essential hypertension 07/27/2018   Joneen Boers PT, DPT   08/27/2018, 9:57 AM  [facility] [address] [place], [address] Phone: [phone]   Fax:  [phone]  Name: Kerry Davis MRN: [id number] Date of Birth: 1965-12-11

## 2018-08-27 NOTE — Patient Instructions (Signed)
  Supine chin tucks  Lying on your back with your head and neck fully supported with a pillow   Give yourself a "double" chin   Hold for 5 seconds   Repeat 10 times   Perform 3 sets daily.

## 2018-08-31 ENCOUNTER — Ambulatory Visit: Payer: 59

## 2018-09-02 ENCOUNTER — Ambulatory Visit: Payer: 59

## 2018-09-07 ENCOUNTER — Ambulatory Visit: Payer: 59 | Admitting: Internal Medicine

## 2018-09-07 ENCOUNTER — Ambulatory Visit: Payer: 59

## 2018-09-09 ENCOUNTER — Telehealth: Payer: Self-pay

## 2018-09-09 ENCOUNTER — Ambulatory Visit: Payer: 59

## 2018-09-09 NOTE — Telephone Encounter (Signed)
No show. Called patient and left a message pertaining to appointment and a reminder for the next follow up session. Return phone call requested. Phone number ([phone]) provided.   

## 2018-09-15 ENCOUNTER — Encounter: Payer: Self-pay | Admitting: Anesthesiology

## 2018-09-15 ENCOUNTER — Ambulatory Visit
Admission: RE | Admit: 2018-09-15 | Discharge: 2018-09-15 | Disposition: A | Discharge status: Home / Self Care | Payer: 59 | Source: Ambulatory Visit | Attending: Gastroenterology | Admitting: Gastroenterology

## 2018-09-15 ENCOUNTER — Ambulatory Visit: Payer: 59 | Admitting: Certified Registered Nurse Anesthetist

## 2018-09-15 ENCOUNTER — Encounter
Admission: RE | Disposition: A | Discharge status: Home / Self Care | Payer: Self-pay | Source: Ambulatory Visit | Attending: Gastroenterology

## 2018-09-15 DIAGNOSIS — E079 Disorder of thyroid, unspecified: Secondary | ICD-10-CM | POA: Insufficient documentation

## 2018-09-15 DIAGNOSIS — F418 Other specified anxiety disorders: Secondary | ICD-10-CM | POA: Diagnosis not present

## 2018-09-15 DIAGNOSIS — I10 Essential (primary) hypertension: Secondary | ICD-10-CM | POA: Diagnosis not present

## 2018-09-15 DIAGNOSIS — Z800 Family history of malignant neoplasm of digestive organs: Secondary | ICD-10-CM | POA: Diagnosis not present

## 2018-09-15 DIAGNOSIS — F419 Anxiety disorder, unspecified: Secondary | ICD-10-CM | POA: Insufficient documentation

## 2018-09-15 DIAGNOSIS — Z79899 Other long term (current) drug therapy: Secondary | ICD-10-CM | POA: Diagnosis not present

## 2018-09-15 DIAGNOSIS — Z7984 Long term (current) use of oral hypoglycemic drugs: Secondary | ICD-10-CM | POA: Insufficient documentation

## 2018-09-15 DIAGNOSIS — E119 Type 2 diabetes mellitus without complications: Secondary | ICD-10-CM | POA: Insufficient documentation

## 2018-09-15 DIAGNOSIS — F17210 Nicotine dependence, cigarettes, uncomplicated: Secondary | ICD-10-CM | POA: Diagnosis not present

## 2018-09-15 DIAGNOSIS — E785 Hyperlipidemia, unspecified: Secondary | ICD-10-CM | POA: Insufficient documentation

## 2018-09-15 DIAGNOSIS — F329 Major depressive disorder, single episode, unspecified: Secondary | ICD-10-CM | POA: Diagnosis not present

## 2018-09-15 DIAGNOSIS — G4733 Obstructive sleep apnea (adult) (pediatric): Secondary | ICD-10-CM | POA: Diagnosis not present

## 2018-09-15 DIAGNOSIS — J45909 Unspecified asthma, uncomplicated: Secondary | ICD-10-CM | POA: Diagnosis not present

## 2018-09-15 DIAGNOSIS — Z8371 Family history of colonic polyps: Secondary | ICD-10-CM | POA: Diagnosis not present

## 2018-09-15 DIAGNOSIS — Z1211 Encounter for screening for malignant neoplasm of colon: Secondary | ICD-10-CM | POA: Diagnosis not present

## 2018-09-15 DIAGNOSIS — Z83719 Family history of colon polyps, unspecified: Secondary | ICD-10-CM

## 2018-09-15 DIAGNOSIS — Z86010 Personal history of colonic polyps: Secondary | ICD-10-CM

## 2018-09-15 DIAGNOSIS — K219 Gastro-esophageal reflux disease without esophagitis: Secondary | ICD-10-CM | POA: Insufficient documentation

## 2018-09-15 HISTORY — PX: COLONOSCOPY WITH PROPOFOL: SHX5780

## 2018-09-15 LAB — GLUCOSE, CAPILLARY: Glucose-Capillary: 127 mg/dL — ABNORMAL HIGH (ref 70–99)

## 2018-09-15 SURGERY — COLONOSCOPY WITH PROPOFOL
Anesthesia: General

## 2018-09-15 MED ORDER — PROPOFOL 500 MG/50ML IV EMUL
INTRAVENOUS | Status: DC | PRN
  Administered 2018-09-15: 160 ug/kg/min via INTRAVENOUS

## 2018-09-15 MED ORDER — SODIUM CHLORIDE 0.9 % IV SOLN
INTRAVENOUS | Status: DC
  Administered 2018-09-15: 1000 mL via INTRAVENOUS

## 2018-09-15 MED ORDER — PROPOFOL 10 MG/ML IV BOLUS
INTRAVENOUS | Status: DC | PRN
  Administered 2018-09-15: 80 mg via INTRAVENOUS

## 2018-09-15 MED ORDER — PROPOFOL 500 MG/50ML IV EMUL
INTRAVENOUS | Status: AC
  Filled 2018-09-15: qty 300

## 2018-09-15 NOTE — Op Note (Signed)
[facility] Patient Name: Kerry Davis Procedure Date: 09/15/2018 7:14 AM MRN: [id number] Account #: [id number] Date of Birth: Apr 01, 1966 Admit Type: Outpatient Age: 52 Room: [facility] ENDO ROOM 4 Gender: Female Note Status: Finalized Procedure:            Colonoscopy Indications:          Family history of colonic polyps in a first-degree                        relative Providers:            [name] MD, MD Referring MD:         Nino Glow Mclean-Scocuzza MD, MD (Referring MD) Medicines:            Propofol per Anesthesia Complications:        No immediate complications. Procedure:            Pre-Anesthesia Assessment:                       - Prior to the procedure, a History and Physical was                        performed, and patient medications and allergies were                        reviewed. The patient's tolerance of previous                        anesthesia was also reviewed. The risks and benefits of                        the procedure and the sedation options and risks were                        discussed with the patient. All questions were                        answered, and informed consent was obtained. Prior                        Anticoagulants: The patient has taken no previous                        anticoagulant or antiplatelet agents. ASA Grade                        Assessment: II - A patient with mild systemic disease.                        After reviewing the risks and benefits, the patient was                        deemed in satisfactory condition to undergo the                        procedure.                       After obtaining informed consent, the colonoscope was  passed under direct vision. Throughout the procedure,                        the patient's blood pressure, pulse, and oxygen                        saturations were monitored continuously. The   Colonoscope was introduced through the anus and                        advanced to the the cecum, identified by appendiceal                        orifice and ileocecal valve. The colonoscopy was                        performed without difficulty. The patient tolerated the                        procedure well. The quality of the bowel preparation                        was excellent. Findings:      The perianal and digital rectal examinations were normal.      The colon (entire examined portion) appeared normal. Impression:           - The entire examined colon is normal.                       - No specimens collected. Recommendation:       - Discharge patient to home.                       - Resume previous diet.                       - Continue present medications.                       - Repeat colonoscopy in 5 years for surveillance. Procedure Code(s):    --- Professional ---                       [phone], Colonoscopy, flexible; diagnostic, including                        collection of specimen(s) by brushing or washing, when                        performed (separate procedure) Diagnosis Code(s):    --- Professional ---                       Z83.71, Family history of colonic polyps CPT copyright 2018 [employer]. All rights reserved. The codes documented in this report are preliminary and upon coder review may  be revised to meet current compliance requirements. [name] MD, MD 09/15/2018 8:06:51 AM This report has been signed electronically. Number of Addenda: 0 Note Initiated On: 09/15/2018 7:14 AM Scope Withdrawal Time: 0 hours 6 minutes 51 seconds  Total Procedure Duration: 0 hours 10 minutes 37 seconds       [facility]

## 2018-09-15 NOTE — Anesthesia Preprocedure Evaluation (Signed)
Anesthesia Evaluation  Patient identified by MRN, date of birth, ID band Patient awake    Reviewed: Allergy & Precautions, H&P , NPO status , Patient's Chart, lab work & pertinent test results  History of Anesthesia Complications Negative for: history of anesthetic complications  Airway Mallampati: II  TM Distance: >3 FB Neck ROM: full    Dental  (+) Chipped, Poor Dentition   Pulmonary neg shortness of breath, asthma , sleep apnea , Current Smoker,           Cardiovascular Exercise Tolerance: Good hypertension, (-) angina(-) Past MI and (-) DOE      Neuro/Psych PSYCHIATRIC DISORDERS negative neurological ROS     GI/Hepatic Neg liver ROS, GERD  Medicated and Controlled,  Endo/Other  diabetes, Type 2  Renal/GU negative Renal ROS  negative genitourinary   Musculoskeletal   Abdominal   Peds  Hematology negative hematology ROS (+)   Anesthesia Other Findings Past Medical History: No date: Anxiety No date: Asthma No date: Cancer [facility])     Comment:  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  No date: Colon polyps No date: Depression No date: Diabetes mellitus without complication (HCC) No date: GERD (gastroesophageal reflux disease) No date: Herpes     Comment:  1+2 No date: Hyperlipidemia No date: Hypertension No date: OSA on CPAP No date: Thyroid disease  Past Surgical History: No date: DILATION AND CURETTAGE OF UTERUS No date: LEEP No date: SALIVARY GLAND SURGERY     Comment:  left  No date: THYROIDECTOMY No date: TUBAL LIGATION  BMI    Body Mass Index:  35.44 kg/m      Reproductive/Obstetrics negative OB ROS                             Anesthesia Physical Anesthesia Plan  ASA: III  Anesthesia Plan: General   Post-op Pain Management:    Induction: Intravenous  PONV Risk Score and Plan: Propofol infusion and TIVA  Airway  Management Planned: Natural Airway and Nasal Cannula  Additional Equipment:   Intra-op Plan:   Post-operative Plan:   Informed Consent: I have reviewed the patients History and Physical, chart, labs and discussed the procedure including the risks, benefits and alternatives for the proposed anesthesia with the patient or authorized representative who has indicated his/her understanding and acceptance.   Dental Advisory Given  Plan Discussed with: Anesthesiologist, CRNA and Surgeon  Anesthesia Plan Comments: (Patient consented for risks of anesthesia including but not limited to:  - adverse reactions to medications - risk of intubation if required - damage to teeth, lips or other oral mucosa - sore throat or hoarseness - Damage to heart, brain, lungs or loss of life  Patient voiced understanding.)        Anesthesia Quick Evaluation

## 2018-09-15 NOTE — Transfer of Care (Signed)
Immediate Anesthesia Transfer of Care Note  Patient: Kerry Davis  Procedure(s) Performed: COLONOSCOPY WITH PROPOFOL (N/A )  Patient Location: PACU  Anesthesia Type:General  Level of Consciousness: awake, alert  and oriented  Airway & Oxygen Therapy: Patient Spontanous Breathing and Patient connected to nasal cannula oxygen  Post-op Assessment: Report given to RN and Post -op Vital signs reviewed and stable  Post vital signs: Reviewed and stable  Last Vitals:  Vitals Value Taken Time  BP 103/61 09/15/2018  8:10 AM  Temp 36.4 C 09/15/2018  8:00 AM  Pulse 77 09/15/2018  8:11 AM  Resp 17 09/15/2018  8:11 AM  SpO2 97 % 09/15/2018  8:11 AM  Vitals shown include unvalidated device data.  Last Pain:  Vitals:   09/15/18 0800  TempSrc: Tympanic  PainSc:          Complications: No apparent anesthesia complications

## 2018-09-15 NOTE — Anesthesia Postprocedure Evaluation (Signed)
Anesthesia Post Note  Patient: VENNESA BASTEDO  Procedure(s) Performed: COLONOSCOPY WITH PROPOFOL (N/A )  Patient location during evaluation: Endoscopy Anesthesia Type: General Level of consciousness: awake and alert Pain management: pain level controlled Vital Signs Assessment: post-procedure vital signs reviewed and stable Respiratory status: spontaneous breathing, nonlabored ventilation, respiratory function stable and patient connected to nasal cannula oxygen Cardiovascular status: blood pressure returned to baseline and stable Postop Assessment: no apparent nausea or vomiting Anesthetic complications: no     Last Vitals:  Vitals:   09/15/18 0830 09/15/18 0840  BP: 137/88 (!) 125/96  Pulse: 73 63  Resp: 13 11  Temp:    SpO2: 98% 100%    Last Pain:  Vitals:   09/15/18 0800  TempSrc: Tympanic  PainSc:                  Precious Haws Kimberli Winne

## 2018-09-15 NOTE — Anesthesia Post-op Follow-up Note (Signed)
Anesthesia QCDR form completed.        

## 2018-09-15 NOTE — H&P (Signed)
Kerry Lame, MD [address] [place], [place] [address] Phone:[phone] Fax : [phone]  Primary Care Physician:  McLean-Scocuzza, Nino Glow, MD Primary Gastroenterologist:  Dr. Allen Norris  Pre-Procedure History & Physical: HPI:  Kerry Davis is a 52 y.o. female is here for an colonoscopy.   Past Medical History:  Diagnosis Date  . Anxiety   . Asthma   . 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 saliva production   . Colon polyps   . Depression   . Diabetes mellitus without complication ([place])   . GERD (gastroesophageal reflux disease)   . Herpes    1+2  . Hyperlipidemia   . Hypertension   . OSA on CPAP   . Thyroid disease     Past Surgical History:  Procedure Laterality Date  . DILATION AND CURETTAGE OF UTERUS    . LEEP    . SALIVARY GLAND SURGERY     left   . THYROIDECTOMY    . TUBAL LIGATION      Prior to Admission medications   Medication Sig Start Date End Date Taking? Authorizing Provider  albuterol (PROVENTIL HFA;VENTOLIN HFA) 108 (90 Base) MCG/ACT inhaler Inhale into the lungs. 04/16/16  Yes [provider]  ALPRAZolam Duanne Moron) 1 MG tablet Take 1 tablet (1 mg total) by mouth daily as needed for anxiety. 08/25/18  Yes McLean-Scocuzza, Nino Glow, MD  atorvastatin (LIPITOR) 10 MG tablet Take 1 tablet (10 mg total) by mouth daily at 6 PM. 08/25/18  Yes McLean-Scocuzza, Nino Glow, MD  Blood Glucose Monitoring Suppl (GLUCOCOM BLOOD GLUCOSE MONITOR) DEVI Ok to fill per pt's insurance formulary 05/20/18  Yes [provider]  Cholecalciferol 50000 units capsule Take 1 capsule (50,000 Units total) by mouth once a week. 08/25/18  Yes McLean-Scocuzza, Nino Glow, MD  cyclobenzaprine (FLEXERIL) 5 MG tablet Take 1 tablet (5 mg total) by mouth at bedtime as needed for muscle spasms. 07/30/18  Yes McLean-Scocuzza, Nino Glow, MD  Dulaglutide 0.75 MG/0.5ML SOPN Inject 0.75 mg into the skin once a week.  05/20/18 05/20/19 Yes [provider]  enalapril (VASOTEC) 10 MG tablet Take 10 mg by mouth daily.  05/20/18  Yes [provider]  FREESTYLE LITE test strip  05/20/18  Yes [provider]  hydrochlorothiazide (HYDRODIURIL) 25 MG tablet Take 25 mg by mouth daily.  05/20/18  Yes [provider]  Lancets (FREESTYLE) lancets  05/20/18  Yes [provider]  levothyroxine (SYNTHROID, LEVOTHROID) 150 MCG tablet  06/22/18  Yes [provider]  metFORMIN (GLUCOPHAGE) 500 MG tablet Take 500 mg by mouth 2 (two) times daily with a meal.  03/19/18 03/19/19 Yes [provider]  metroNIDAZOLE (FLAGYL) 500 MG tablet Take 1 tablet (500 mg total) by mouth 2 (two) times daily. With food 08/25/18  Yes McLean-Scocuzza, Nino Glow, MD  omeprazole (PRILOSEC) 40 MG capsule Take by mouth. 05/19/15  Yes [provider]  propranolol (INDERAL) 10 MG tablet Take by mouth. 02/28/16  Yes [provider]  traZODone (DESYREL) 100 MG tablet Take 100 mg by mouth at bedtime.  05/20/18  Yes [provider]  venlafaxine XR (EFFEXOR-XR) 75 MG 24 hr capsule Take 1 capsule (75 mg total) by mouth daily with breakfast. 08/25/18  Yes McLean-Scocuzza, Nino Glow, MD    Allergies as of 08/10/2018  . (No Known Allergies)    Family History  Problem Relation Age of Onset  . Cancer Mother  breast  . Diabetes Mother   . Heart disease Mother        CHF  . Hypertension Mother   . Hyperparathyroidism Mother   . Diabetes Father   . Heart disease Father        CHF  . Hypertension Father   . Thyroid nodules Daughter   . Hypertension Son        ?  . Stroke Maternal Grandmother   . Cancer Maternal Grandmother        colon   . Cancer Paternal Grandfather        thyroid  . Diabetes Daughter        2    Social History   Socioeconomic History  . Marital status: Legally Separated    Spouse name: Not on file  . Number of children: Not on file  . Years of education: Not on file  . Highest  education level: Not on file  Occupational History  . Not on file  Social Needs  . Financial resource strain: Not on file  . Food insecurity:    Worry: Not on file    Inability: Not on file  . Transportation needs:    Medical: Not on file    Non-medical: Not on file  Tobacco Use  . Smoking status: Current Every Day Smoker  . Smokeless tobacco: Never Used  . Tobacco comment: 1/2 ppd   Substance and Sexual Activity  . Alcohol use: Yes  . Drug use: Not Currently  . Sexual activity: Yes    Comment: men  Lifestyle  . Physical activity:    Days per week: Not on file    Minutes per session: Not on file  . Stress: Not on file  Relationships  . Social connections:    Talks on phone: Not on file    Gets together: Not on file    Attends religious service: Not on file    Active member of club or organization: Not on file    Attends meetings of clubs or organizations: Not on file    Relationship status: Not on file  . Intimate partner violence:    Fear of current or ex partner: Not on file    Emotionally abused: Not on file    Physically abused: Not on file    Forced sexual activity: Not on file  Other Topics Concern  . 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 Neck:  Supple; no masses or thyromegaly. Lungs:  Clear throughout to auscultation.    Heart:  Regular rate and rhythm. Abdomen:  Soft, nontender and nondistended. Normal bowel sounds, without guarding, and without rebound.   Neurologic:  Alert and  oriented x4;  grossly normal neurologically.  Impression/Plan: ZOHAL RENY is here for an  colonoscopy to be performed for family history of colon polyps  Risks, benefits, limitations, and alternatives regarding  colonoscopy have been reviewed with the patient.  Questions have been answered.  All parties agreeable.   Kerry Lame, MD  09/15/2018, 7:40 AM

## 2018-09-15 NOTE — Anesthesia Procedure Notes (Signed)
Performed by: Urian Martenson, CRNA Pre-anesthesia Checklist: Patient identified, Emergency Drugs available, Suction available, Patient being monitored and Timeout performed Patient Re-evaluated:Patient Re-evaluated prior to induction Oxygen Delivery Method: Nasal cannula Induction Type: IV induction       

## 2018-09-16 ENCOUNTER — Encounter: Payer: Self-pay | Admitting: Gastroenterology

## 2018-09-21 ENCOUNTER — Ambulatory Visit: Payer: 59 | Attending: Internal Medicine

## 2018-09-21 ENCOUNTER — Telehealth: Payer: Self-pay

## 2018-09-21 NOTE — Telephone Encounter (Signed)
No show. Called patient and left a message pertaining to appointment and a reminder for the next follow up session. Return phone call requested. Phone number ([phone]) provided.   

## 2018-09-23 ENCOUNTER — Telehealth: Payer: Self-pay

## 2018-09-23 ENCOUNTER — Ambulatory Visit: Payer: 59

## 2018-09-23 NOTE — Telephone Encounter (Signed)
No show. Clinical assistant called patient pertaining to today's session as well as informed her of previous missed appointments. Reminder for next 2 follow up sessions provided. Pt also informed that she might have to be removed from the schedule due to the no show/cancellation policy if we do not hear from her/or if she is unable to attend her next 2 follow up sessions. Return phone call requested.

## 2018-09-29 ENCOUNTER — Ambulatory Visit: Payer: 59

## 2018-10-01 ENCOUNTER — Ambulatory Visit: Payer: 59

## 2018-10-05 ENCOUNTER — Ambulatory Visit: Payer: 59

## 2018-10-07 ENCOUNTER — Ambulatory Visit: Payer: 59

## 2018-10-07 NOTE — Progress Notes (Signed)
Name: Kerry Davis  MRN/ DOB: [id number], 12/28/1965    Age/ Sex: 52 y.o., female    PCP: McLean-Scocuzza, Nino Glow, MD   Reason for Endocrinology Evaluation: Hx of papillary thyroid cancer      Date of Initial Endocrinology Evaluation: 10/08/2018     HPI: [name] is a 52 y.o. female with a past medical history of spondylolesthesis, asthma, depression, T2DM, GAD, HTN, papillary thyroid cancer. . The patient presented for initial endocrinology clinic visit on 10/08/2018 for consultative assistance with her T2DM and papillary thyroid carcinoma.    She was diagnosed with follicular and papillary variant in 2011 12/2009 right lobectomy, tumor 1.6 cm, infiltrative and involved margins, one L.N negative) . 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, follicular subtype) 02/6567 99 mCi RAI  04/2010 post therapy whole body scan with uptake in the neck  06/2012 U/S revealed an enlarged level 6 L.N 06/2012 surgical resection of recurrent metastatic papillary cancer 3/3 (largest 2.2 cm) 08/2012 149 mCi RAI  09/2012 post-therapy WBS negative for abnormal uptake  Tg remained low through out  10/2013 Thyrogen stimulated WBS was negative and Tg 0.2, PET showed + neck L.N  11/2013 ultrasound stable 05/2014 U/S stable with sub-centimeter nodes 10/2014 I-131 WBS negative, PET scan stable "Grossly unchanged appearance of multiple bilateral 3-6 mm mildly FDG avid cervical lymph nodes 05/2015 : U/S no evidence of residual thyroid tissue , unremarkable L.N . No lesion requiring biopsy 08/2016 Thyrogen stimulated TG was 1.5 (negative) per  records.    She denies any neck swelling, pain, she has occasional dysphagia which is chronic but no hoarseness. Has recent cold    Paternal GF with  thyroid cancer  Daughter with thyroid nodule     Diabetes :   She was diagnosed in 2011  Prior Medications tried/Intolerance: Insulin Currently checking blood  sugars 0 x / day.  Hypoglycemia episodes : no             Hemoglobin A1c has ranged from 6.9% in 2019, peaking at 15.0% in 2011 Patient required assistance for hypoglycemia: No Patient has required hospitalization within the last 1 year from hyper or hypoglycemia: No   In terms of diet, the patient avoids sugar-sweetened beverages . Eats 1-2 meals a day.   Works at [employer].    HOME REGIMEN: Levothyroxine 150 mcg daily  Metformin 127 mg BID Trulicity 5.17 mg weekly   Statin: Yes ACE-I/ARB: yes Prior Diabetic Education: No     DIABETIC COMPLICATIONS: Microvascular complications:    Denies: retinopathy, nephropathy, neuropathy  Last eye exam: Completed 2019  Macrovascular complications:    Denies: CAD, PVD, CVA      HISTORY:  Past Medical History:  Past Medical History:  Diagnosis Date  . Anxiety   . Asthma   . 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 saliva production   . Colon polyps   . Depression   . Diabetes mellitus without complication ([place])   . GERD (gastroesophageal reflux disease)   . Herpes    1+2  . Hyperlipidemia   . Hypertension   . OSA on CPAP   . Thyroid disease    Past Surgical History:  Past Surgical History:  Procedure Laterality Date  . COLONOSCOPY WITH PROPOFOL N/A 09/15/2018   Procedure: COLONOSCOPY WITH PROPOFOL;  Surgeon: [name], MD;  Location: [facility] END[facility]COPY;  Service: Endoscopy;  Laterality: N/A;  . DILATION  AND CURETTAGE OF UTERUS    . LEEP    . SALIVARY GLAND SURGERY     left   . THYROIDECTOMY    . TUBAL LIGATION        Social History:  reports that she has been smoking. She has never used smokeless tobacco. She reports that she drinks alcohol. She reports that she has current or past drug history.  Family History: family history includes Cancer in her maternal grandmother, mother, and paternal grandfather; Diabetes in her daughter, father, and mother; Heart disease  in her father and mother; Hyperparathyroidism in her mother; Hypertension in her father, mother, and son; Stroke in her maternal grandmother; Thyroid nodules in her daughter.   HOME MEDICATIONS: Current Outpatient Medications on File Prior to Visit  Medication Sig Dispense Refill  . albuterol (PROVENTIL HFA;VENTOLIN HFA) 108 (90 Base) MCG/ACT inhaler Inhale into the lungs.    . ALPRAZolam (XANAX) 1 MG tablet Take 1 tablet (1 mg total) by mouth daily as needed for anxiety. 30 tablet 5  . atorvastatin (LIPITOR) 10 MG tablet Take 1 tablet (10 mg total) by mouth daily at 6 PM. 90 tablet 3  . Blood Glucose Monitoring Suppl (GLUCOCOM BLOOD GLUCOSE MONITOR) DEVI Ok to fill per pt's insurance formulary    . Cholecalciferol 50000 units capsule Take 1 capsule (50,000 Units total) by mouth once a week. 13 capsule 1  . cyclobenzaprine (FLEXERIL) 5 MG tablet Take 1 tablet (5 mg total) by mouth at bedtime as needed for muscle spasms. 30 tablet 1  . Dulaglutide 0.75 MG/0.5ML SOPN Inject 0.75 mg into the skin once a week.     . enalapril (VASOTEC) 10 MG tablet Take 10 mg by mouth daily.     Marland Kitchen FREESTYLE LITE test strip   3  . hydrochlorothiazide (HYDRODIURIL) 25 MG tablet Take 25 mg by mouth daily.   2  . Lancets (FREESTYLE) lancets   3  . levothyroxine (SYNTHROID, LEVOTHROID) 150 MCG tablet   0  . metFORMIN (GLUCOPHAGE) 500 MG tablet Take 500 mg by mouth 2 (two) times daily with a meal.     . metroNIDAZOLE (FLAGYL) 500 MG tablet Take 1 tablet (500 mg total) by mouth 2 (two) times daily. With food 14 tablet 0  . omeprazole (PRILOSEC) 40 MG capsule Take by mouth.    . propranolol (INDERAL) 10 MG tablet Take by mouth.    . traZODone (DESYREL) 100 MG tablet Take 100 mg by mouth at bedtime.   3  . venlafaxine XR (EFFEXOR-XR) 75 MG 24 hr capsule Take 1 capsule (75 mg total) by mouth daily with breakfast. 90 capsule 1   No current facility-administered medications on file prior to visit.       REVIEW OF  SYSTEMS: A comprehensive ROS was conducted with the patient and is negative except as per HPI and below:  Review of Systems  Constitutional: Positive for malaise/fatigue. Negative for weight loss.  HENT: Positive for congestion and sore throat.   Eyes: Negative for blurred vision and pain.       Dry eyes   Respiratory: Positive for cough. Negative for shortness of breath.   Cardiovascular: Negative for chest pain and palpitations.  Gastrointestinal: Positive for constipation. Negative for nausea.  Genitourinary: Negative for frequency.  Skin: Negative.   Neurological: Positive for tingling. Negative for tremors.       In fingers   Endo/Heme/Allergies: Negative for polydipsia.  Psychiatric/Behavioral: Negative for depression. The patient is not nervous/anxious.  OBJECTIVE:  VS: BP (!) 140/96   Pulse 75   Ht 5\' 9"  (1.753 m)   Wt 250 lb 9.6 oz (113.7 kg)   SpO2 99%   BMI 37.01 kg/m    Wt Readings from Last 3 Encounters:  10/08/18 250 lb 9.6 oz (113.7 kg)  09/15/18 240 lb (108.9 kg)  08/25/18 238 lb 12.8 oz (108.3 kg)     EXAM: General: Pt appears well and is in NAD  Hydration: Well-hydrated with moist mucous membranes and good skin turgor  Eyes: External eye exam normal without stare, lid lag or exophthalmos.  EOM intact.  PERRL.  Ears, Nose, Throat: Hearing: Grossly intact bilaterally Dental: Good dentition  Throat: Clear without mass, erythema or exudate  Neck: General: Supple without adenopathy. Thyroid: Surgically absent.  No goiter or nodules appreciated. No thyroid bruit.  Lungs: Clear with good BS bilat with no rales, rhonchi, or wheezes  Heart: Auscultation: RRR.  Abdomen: Normoactive bowel sounds, soft, nontender, without masses or organomegaly palpable  Extremities:  BL LE: No pretibial edema normal ROM and strength.  Skin: Hair: Texture and amount normal with gender appropriate distribution Skin Inspection: No rashes, acanthosis nigricans/skin tags. No  lipohypertrophy Skin Palpation: Skin temperature, texture, and thickness normal to palpation  Neuro: Cranial nerves: II - XII grossly intact  Motor: Normal strength throughout DTRs: 2+ and symmetric in UE without delay in relaxation phase  Mental Status: Judgment, insight: Intact Orientation: Oriented to time, place, and person Memory: Intact for recent and remote events Mood and affect: No depression, anxiety, or agitation   DM foot exam: The skin of the feet is intact without sores or ulcerations. The pedal pulses are 2+ on right and 2+ on left. The sensation is intact to a screening 5.07, 10 gram monofilament bilaterally    DATA REVIEWED:  12/25/2017  TSH 3.088 uIU/mL  FT4 1.82 ng/dL   Tg Ab < 1.8 IU/mL   Tg 1.1 ng/mL ( It was 0.4 in 04/2017 TSH of 0.089 uIU/mL )      Results for SUMMERS, BUENDIA (MRN [id number]) as of 10/09/2018 07:48  Ref. Range 10/08/2018 10:12  TSH Latest Ref Range: 0.35 - 4.50 uIU/mL 0.51  T4,Free(Direct) Latest Ref Range: 0.60 - 1.60 ng/dL 1.02    ASSESSMENT/PLAN/RECOMMENDATIONS:   1. Hx of Papillary Thyroid Cancer: S/P thyroidectomy and RAI ablation x 2 (2011 &2013):  - Pt has metastatic recurrent to L.N in 2013. Post-treatment scan has been negative - Pt is clinically euthyroid - Repeat TSH today within the goal of 0.5-2.0 uIu/mL , awaiting on Tg and Tg Ab.  - As per her previous endocrinologist she will need another Thyrogen stimulated tumor marker in 2020. - So  far she has had excellent structural and biochemical response - LT-4 replacement has been reduced in sometimes in 2018 per patient, in early 2019 she was noted to have a higher TSH as well as aTg level. Based on her labs today, we will continue current dose of LT-4 replacement and will keep goal at 0.5-2.0 uIU/mL    Medications Continue Levothyroxine 150 mcg daily   2. Post-operative Hypothyroidism : - Clinically euthyroid - TSH goal 0.5-2.0  - Pt educated extensively on the  correct way to take levothyroxine (first thing in the morning with water, 30 minutes before eating or taking other medications). - Pt encouraged to double dose the following day if she were to miss a dose given long half-life of levothyroxine.  Medications Levothyroxine 150 mcg daily  3. Type 2 Diabetes Mellitus, excellent control, without complications: K8A 0.6%   GENERAL:  Praised patient on medication adherence and dietary discretions.   She denies any side effects from the meds.   Discussed importance of euglycemia in reducing microvascular complications  She was advised to check glucose fasting as much as she can (currently doesn't check )  MEDICATIONS:  Continue Metformin 500 mg BID  Continue Trulicity 0.15 mg weekly   EDUCATION / INSTRUCTIONS:  BG monitoring instructions: Patient is instructed to check her blood sugars 1 times a day, fasting.  Call [facility] if: BG persistently < 70 or > 300.   4) Diabetic complications:   Eye: Does not have known diabetic retinopathy. Last eye exam was within 6 months  Neuro/ Feet: Does not have known diabetic peripheral neuropathy.  Renal: Patient does not have known baseline CKD. She is on an ACEI/ARB at present.   5) Lipids: Patient is on a statin. LDL is above goal at 125 mg/dL , pt admits to non-compliance, encouraged compliance and discussed risk of CAD , PVD and CVA   6) Hypertension: She is not at goal of < 140/90 mmHg. Will defer to PCP. Asymptomatic at this time.   7.) Tobacco abuse: over 3 minutes were spent in counseling about tobacco cessation   F/u in 3 months    Signed electronically by: Mack Guise, MD  [facility]  [facility] [address] [place], [place] [address] Phone: [phone] FAX: [phone]   CC: McLean-Scocuzza, Nino Glow, MD [place] [address] Phone: [phone] Fax: [phone]   Return  to Endocrinology clinic as below: Future Appointments  Date Time Provider [place]  12/01/2018  9:00 AM McLean-Scocuzza, Nino Glow, MD [id number]

## 2018-10-08 ENCOUNTER — Ambulatory Visit: Payer: 59 | Admitting: Internal Medicine

## 2018-10-08 ENCOUNTER — Encounter: Payer: Self-pay | Admitting: Internal Medicine

## 2018-10-08 VITALS — BP 140/96 | HR 75 | Ht 69.0 in | Wt 250.6 lb

## 2018-10-08 DIAGNOSIS — E890 Postprocedural hypothyroidism: Secondary | ICD-10-CM

## 2018-10-08 DIAGNOSIS — E119 Type 2 diabetes mellitus without complications: Secondary | ICD-10-CM

## 2018-10-08 DIAGNOSIS — C73 Malignant neoplasm of thyroid gland: Secondary | ICD-10-CM

## 2018-10-08 LAB — TSH: TSH: 0.51 u[IU]/mL (ref 0.35–4.50)

## 2018-10-08 LAB — T4, FREE: Free T4: 1.02 ng/dL (ref 0.60–1.60)

## 2018-10-08 NOTE — Patient Instructions (Addendum)
-   Check sugars fasting  - You are on levothyroxine - which is your thyroid hormone supplement. You MUST take this consistently.  You should take this first thing in the morning on an empty stomach with water. You should not take it with other medications. Wait 89min to 1hr prior to eating. If you are taking any vitamins - please take these in the evening.   If you miss a dose, please take your missed dose the following day (double the dose for that day). You should have a pill box for ONLY levothyroxine on your bedside table to help you remember to take your medications.

## 2018-10-09 ENCOUNTER — Ambulatory Visit (INDEPENDENT_AMBULATORY_CARE_PROVIDER_SITE_OTHER): Payer: 59 | Admitting: Internal Medicine

## 2018-10-09 ENCOUNTER — Encounter: Payer: Self-pay | Admitting: Internal Medicine

## 2018-10-09 VITALS — BP 122/78 | HR 88 | Temp 98.0°F | Ht 69.0 in | Wt 250.2 lb

## 2018-10-09 DIAGNOSIS — J069 Acute upper respiratory infection, unspecified: Secondary | ICD-10-CM | POA: Diagnosis not present

## 2018-10-09 DIAGNOSIS — R05 Cough: Secondary | ICD-10-CM

## 2018-10-09 DIAGNOSIS — R059 Cough, unspecified: Secondary | ICD-10-CM

## 2018-10-09 LAB — THYROGLOBULIN ANTIBODY

## 2018-10-09 LAB — THYROGLOBULIN LEVEL: Thyroglobulin: 1.1 ng/mL — ABNORMAL LOW

## 2018-10-09 MED ORDER — HYDROCOD POLST-CPM POLST ER 10-8 MG/5ML PO SUER: 5.0000 mL | Freq: Every evening | ORAL | 0 refills | Status: DC | PRN

## 2018-10-09 MED ORDER — AZITHROMYCIN 250 MG PO TABS: ORAL_TABLET | ORAL | 0 refills | Status: DC

## 2018-10-09 NOTE — Progress Notes (Signed)
Pre visit review using our clinic review tool, if applicable. No additional management support is needed unless otherwise documented below in the visit note. 

## 2018-10-09 NOTE — Patient Instructions (Signed)
Upper Respiratory Infection, Adult Most upper respiratory infections (URIs) are a viral infection of the air passages leading to the lungs. A URI affects the nose, throat, and upper air passages. The most common type of URI is nasopharyngitis and is typically referred to as "the common cold." URIs run their course and usually go away on their own. Most of the time, a URI does not require medical attention, but sometimes a bacterial infection in the upper airways can follow a viral infection. This is called a secondary infection. Sinus and middle ear infections are common types of secondary upper respiratory infections. Bacterial pneumonia can also complicate a URI. A URI can worsen asthma and chronic obstructive pulmonary disease (COPD). Sometimes, these complications can require emergency medical care and may be life threatening. What are the causes? Almost all URIs are caused by viruses. A virus is a type of germ and can spread from one person to another. What increases the risk? You may be at risk for a URI if:  You smoke.  You have chronic heart or lung disease.  You have a weakened defense (immune) system.  You are very young or very old.  You have nasal allergies or asthma.  You work in crowded or poorly ventilated areas.  You work in health care facilities or schools.  What are the signs or symptoms? Symptoms typically develop 2-3 days after you come in contact with a cold virus. Most viral URIs last 7-10 days. However, viral URIs from the influenza virus (flu virus) can last 14-18 days and are typically more severe. Symptoms may include:  Runny or stuffy (congested) nose.  Sneezing.  Cough.  Sore throat.  Headache.  Fatigue.  Fever.  Loss of appetite.  Pain in your forehead, behind your eyes, and over your cheekbones (sinus pain).  Muscle aches.  How is this diagnosed? Your health care provider may diagnose a URI by:  Physical exam.  Tests to check that your  symptoms are not due to another condition such as: ? Strep throat. ? Sinusitis. ? Pneumonia. ? Asthma.  How is this treated? A URI goes away on its own with time. It cannot be cured with medicines, but medicines may be prescribed or recommended to relieve symptoms. Medicines may help:  Reduce your fever.  Reduce your cough.  Relieve nasal congestion.  Follow these instructions at home:  Take medicines only as directed by your health care provider.  Gargle warm saltwater or take cough drops to comfort your throat as directed by your health care provider.  Use a warm mist humidifier or inhale steam from a shower to increase air moisture. This may make it easier to breathe.  Drink enough fluid to keep your urine clear or pale yellow.  Eat soups and other clear broths and maintain good nutrition.  Rest as needed.  Return to work when your temperature has returned to normal or as your health care provider advises. You may need to stay home longer to avoid infecting others. You can also use a face mask and careful hand washing to prevent spread of the virus.  Increase the usage of your inhaler if you have asthma.  Do not use any tobacco products, including cigarettes, chewing tobacco, or electronic cigarettes. If you need help quitting, ask your health care provider. How is this prevented? The best way to protect yourself from getting a cold is to practice good hygiene.  Avoid oral or hand contact with people with cold symptoms.  Wash your   hands often if contact occurs.  There is no clear evidence that vitamin C, vitamin E, echinacea, or exercise reduces the chance of developing a cold. However, it is always recommended to get plenty of rest, exercise, and practice good nutrition. Contact a health care provider if:  You are getting worse rather than better.  Your symptoms are not controlled by medicine.  You have chills.  You have worsening shortness of breath.  You have  brown or red mucus.  You have yellow or brown nasal discharge.  You have pain in your face, especially when you bend forward.  You have a fever.  You have swollen neck glands.  You have pain while swallowing.  You have white areas in the back of your throat. Get help right away if:  You have severe or persistent: ? Headache. ? Ear pain. ? Sinus pain. ? Chest pain.  You have chronic lung disease and any of the following: ? Wheezing. ? Prolonged cough. ? Coughing up blood. ? A change in your usual mucus.  You have a stiff neck.  You have changes in your: ? Vision. ? Hearing. ? Thinking. ? Mood. This information is not intended to replace advice given to you by your health care provider. Make sure you discuss any questions you have with your health care provider. Document Released: 04/30/2001 Document Revised: 07/07/2016 Document Reviewed: 02/09/2014 [employer] Interactive Patient Education  2018 [employer]  

## 2018-10-09 NOTE — Progress Notes (Signed)
Chief Complaint  Patient presents with  . Cough   Acute visit  1. 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 She is a smoker tried Sudafed and Benadryl, denies sneezing but has had watery eyes. No fever but voice is hoarse and denies sore throat. Cough is keeping her up at night    Review of Systems  Constitutional: Negative for fever.  HENT: Negative for sore throat.        +hoarse    Eyes: Negative for blurred vision.  Respiratory: Positive for cough and shortness of breath. Negative for wheezing.   Cardiovascular: Negative for chest pain.  Musculoskeletal: Negative for myalgias.  Neurological: Negative for headaches.   Past Medical History:  Diagnosis Date  . Anxiety   . Asthma   . 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 Colon polyps   . Depression   . Diabetes mellitus without complication ([place])   . GERD (gastroesophageal reflux disease)   . Herpes    1+2  . Hyperlipidemia   . Hypertension   . OSA on CPAP   . Thyroid disease    Past Surgical History:  Procedure Laterality Date  . COLONOSCOPY WITH PROPOFOL N/A 09/15/2018   Procedure: COLONOSCOPY WITH PROPOFOL;  Surgeon: [name], MD;  Location: [facility] ENDOSCOPY;  Service: Endoscopy;  Laterality: N/A;  . DILATION AND CURETTAGE OF UTERUS    . LEEP    . SALIVARY GLAND SURGERY     left   . THYROIDECTOMY    . TUBAL LIGATION     Family History  Problem Relation Age of Onset  . Cancer Mother        breast  . Diabetes Mother   . Heart disease Mother        CHF  . Hypertension Mother   . Hyperparathyroidism Mother   . Diabetes Father   . Heart disease Father        CHF  . Hypertension Father   . Thyroid nodules Daughter   . Hypertension Son        ?  . Stroke Maternal Grandmother   . Cancer Maternal Grandmother        colon   . Cancer Paternal Grandfather        thyroid  . Diabetes  Daughter        2   Social History   Socioeconomic History  . Marital status: Legally Separated    Spouse name: Not on file  . Number of children: Not on file  . Years of education: Not on file  . Highest education level: Not on file  Occupational History  . Not on file  Social Needs  . Financial resource strain: Not on file  . Food insecurity:    Worry: Not on file    Inability: Not on file  . Transportation needs:    Medical: Not on file    Non-medical: Not on file  Tobacco Use  . Smoking status: Current Every Day Smoker  . Smokeless tobacco: Never Used  . Tobacco comment: 1/2 ppd   Substance and Sexual Activity  . Alcohol use: Yes  . Drug use: Not Currently  . Sexual activity: Yes    Comment: men  Lifestyle  . Physical activity:    Days per week: Not on file    Minutes per session: Not on file  .  Stress: Not on file  Relationships  . Social connections:    Talks on phone: Not on file    Gets together: Not on file    Attends religious service: Not on file    Active member of club or organization: Not on file    Attends meetings of clubs or organizations: Not on file    Relationship status: Not on file  . Intimate partner violence:    Fear of current or ex partner: Not on file    Emotionally abused: Not on file    Physically abused: Not on file    Forced sexual activity: Not on file  Other Topics Concern  . 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 albuterol (PROVENTIL HFA;VENTOLIN HFA) 108 (90 Base) MCG/ACT inhaler Inhale into the lungs.  . ALPRAZolam (XANAX) 1 MG tablet Take 1 tablet (1 mg total) by mouth daily as needed for anxiety.  Marland Kitchen atorvastatin (LIPITOR) 10 MG tablet Take 1 tablet (10 mg total) by mouth daily at 6 PM.  . Blood Glucose Monitoring Suppl (GLUCOCOM BLOOD GLUCOSE MONITOR) DEVI Ok to fill per pt's insurance formulary  .  Cholecalciferol 50000 units capsule Take 1 capsule (50,000 Units total) by mouth once a week.  . cyclobenzaprine (FLEXERIL) 5 MG tablet Take 1 tablet (5 mg total) by mouth at bedtime as needed for muscle spasms.  . Dulaglutide 0.75 MG/0.5ML SOPN Inject 0.75 mg into the skin once a week.   . enalapril (VASOTEC) 10 MG tablet Take 10 mg by mouth daily.   Marland Kitchen FREESTYLE LITE test strip   . hydrochlorothiazide (HYDRODIURIL) 25 MG tablet Take 25 mg by mouth daily.   . Lancets (FREESTYLE) lancets   . levothyroxine (SYNTHROID, LEVOTHROID) 150 MCG tablet   . metFORMIN (GLUCOPHAGE) 500 MG tablet Take 500 mg by mouth 2 (two) times daily with a meal.   . metroNIDAZOLE (FLAGYL) 500 MG tablet Take 1 tablet (500 mg total) by mouth 2 (two) times daily. With food  . omeprazole (PRILOSEC) 40 MG capsule Take by mouth.  . propranolol (INDERAL) 10 MG tablet Take by mouth.  . traZODone (DESYREL) 100 MG tablet Take 100 mg by mouth at bedtime.   Marland Kitchen venlafaxine XR (EFFEXOR-XR) 75 MG 24 hr capsule Take 1 capsule (75 mg total) by mouth daily with breakfast.   No Known Allergies Recent Results (from the past 2160 hour(s))  POCT urine pregnancy     Status: Normal   Collection Time: 07/24/18  2:36 PM  Result Value Ref Range   Preg Test, Ur Negative Negative  Urine cytology ancillary only     Status: None   Collection Time: 07/31/18 12:00 AM  Result Value Ref Range   Chlamydia Negative     Comment: Normal Reference Range - Negative   Neisseria gonorrhea Negative     Comment: Normal Reference Range - Negative   Trichomonas Negative     Comment: Normal Reference Range - Negative  Urine cytology ancillary only     Status: Abnormal   Collection Time: 07/31/18 12:00 AM  Result Value Ref Range   Bacterial vaginitis (A)     **POSITIVE for Atopobium vaginae, POSITIVE for Megasphaera 1, POSITIVE for Gardnerella vaginalis, POSITIVE for BVAB2**    Comment: Normal Reference Range - Negative  HSV 2 antibody, IgG  Status:  Abnormal   Collection Time: 07/31/18  9:04 AM  Result Value Ref Range   HSV 2 Glycoprotein G Ab, IgG 10.70 (H) index    Comment:                           Index          Interpretation                           -----          --------------                           <0.90          Negative                           0.90-1.09      Equivocal                           >1.09          Positive . This assay utilizes recombinant type-specific antigens to differentiate HSV-1 from HSV-2 infections. A positive result cannot distinguish between recent and past infection. If recent HSV infection is suspected but the results are negative or equivocal, the assay should be repeated in 4-6 weeks. The performance characteristics of the assay have not been established for pediatric populations, immunocompromised patients, or neonatal screening.   HSV 1 antibody, IgG     Status: Abnormal   Collection Time: 07/31/18  9:04 AM  Result Value Ref Range   HSV 1 Glycoprotein G Ab, IgG 26.70 (H) index    Comment:                           Index          Interpretation                           -----          --------------                           <0.90          Negative                           0.90-1.09      Equivocal                           >1.09          Positive . This assay utilizes recombinant type-specific antigens to differentiate HSV-1 from HSV-2 infections. A positive result cannot distinguish between recent and past infection. If recent HSV infection is suspected but the results are negative or equivocal, the assay should be repeated in 4-6 weeks. The performance characteristics of the assay have not been established for pediatric populations, immunocompromised patients, or neonatal screening.   Hepatitis B surface antigen     Status: None   Collection Time: 07/31/18  9:04 AM  Result Value Ref Range   Hepatitis B Surface Ag NON-REACTIVE NON-REACTI  Hepatitis B surface  antibody,quantitative  Status: None   Collection Time: 07/31/18  9:04 AM  Result Value Ref Range   Hepatitis B-Post 185 > OR = 10 mIU/mL    Comment: . Patient has immunity to hepatitis B virus. . For additional information, please refer to http://education.[facility].com/faq/FAQ105 (This link is being provided for informational/ educational purposes only).   Hepatitis C antibody     Status: None   Collection Time: 07/31/18  9:04 AM  Result Value Ref Range   Hepatitis C Ab NON-REACTIVE NON-REACTI   SIGNAL TO CUT-OFF 0.01 <1.00    Comment: . HCV antibody was non-reactive. There is no laboratory  evidence of HCV infection. . In most cases, no further action is required. However, if recent HCV exposure is suspected, a test for HCV RNA (test code [phone]) is suggested. . For additional information please refer to http://education.[facility].com/faq/FAQ22v1 (This link is being provided for informational/ educational purposes only.) .   RPR     Status: None   Collection Time: 07/31/18  9:04 AM  Result Value Ref Range   RPR Ser Ql NON-REACTIVE NON-REACTI  HIV antibody (with reflex)     Status: None   Collection Time: 07/31/18  9:04 AM  Result Value Ref Range   HIV 1&2 Ab, 4th Generation NON-REACTIVE NON-REACTI    Comment: HIV-1 antigen and HIV-1/HIV-2 antibodies were not detected. There is no laboratory evidence of HIV infection. Marland Kitchen PLEASE NOTE: This information has been disclosed to you from records whose confidentiality may be protected by state law.  If your state requires such protection, then the state law prohibits you from making any further disclosure of the information without the specific written consent of the person to whom it pertains, or as otherwise permitted by law. A general authorization for the release of medical or other information is NOT sufficient for this purpose. . For additional information please refer  to http://education.[facility].com/faq/FAQ106 (This link is being provided for informational/ educational purposes only.) . Marland Kitchen The performance of this assay has not been clinically validated in patients less than 73 years old. .   Cyclic citrul peptide antibody, IgG     Status: None   Collection Time: 07/31/18  9:04 AM  Result Value Ref Range   Cyclic Citrullin Peptide Ab <16 UNITS    Comment: Reference Range Negative:            <20 Weak Positive:       20-39 Moderate Positive:   40-59 Strong Positive:     >59 .   C-reactive protein     Status: Abnormal   Collection Time: 07/31/18  9:04 AM  Result Value Ref Range   CRP 0.3 (L) 0.5 - 20.0 mg/dL  Sedimentation rate     Status: Abnormal   Collection Time: 07/31/18  9:04 AM  Result Value Ref Range   Sed Rate 51 (H) 0 - 30 mm/hr  Antinuclear Antib (ANA)     Status: None   Collection Time: 07/31/18  9:04 AM  Result Value Ref Range   Anti Nuclear Antibody(ANA) NEGATIVE NEGATIVE    Comment: ANA IFA is a first line screen for detecting the presence of up to approximately 150 autoantibodies in various autoimmune diseases. A negative ANA IFA result suggests an ANA-associated autoimmune disease is not present at this time, but is not definitive. If there is high clinical suspicion for Sjogren's syndrome, testing for anti-SS-A/Ro antibody should be considered. Anti-Jo-1 antibody should be considered for clinically suspected inflammatory myopathies. . AC-0: Negative . International Consensus on  ANA Patterns (https://www.hernandez-brewer.com/) . For additional information, please refer to http://education.[facility].com/faq/FAQ177 (This link is being provided for informational/ educational purposes only.) .   Rheumatoid Factor     Status: None   Collection Time: 07/31/18  9:04 AM  Result Value Ref Range   Rhuematoid fact SerPl-aCnc <14 <14 IU/mL  FSH     Status: None   Collection Time: 07/31/18  9:04 AM   Result Value Ref Range   FSH 54.9 mIU/ML    Comment: Female Reference Range:  1.4-18.1 mIU/mLFemale Reference Range:Follicular Phase          2.5-10.2 mIU/mLMidCycle Peak          3.4-33.4 mIU/mLLuteal Phase          1.5-9.1 mIU/mLPost Menopausal     23.0-116.3 mIU/mLPregnant          <0.3 mIU/mL  Vitamin D (25 hydroxy)     Status: Abnormal   Collection Time: 07/31/18  9:04 AM  Result Value Ref Range   VITD 22.90 (L) 30.00 - 100.00 ng/mL  Urine Microalbumin w/creat. ratio     Status: None   Collection Time: 07/31/18  9:04 AM  Result Value Ref Range   Creatinine, Urine 37 20 - 275 mg/dL   Microalb, Ur <0.2 mg/dL    Comment: Reference Range Not established    Microalb Creat Ratio NOTE <30 mcg/mg creat    Comment: The microalbumin value is less than 0.2 mg/dL therefore we are unable to calculate excretion and/or creatinine ratio. . . The ADA defines abnormalities in albumin excretion as follows: Marland Kitchen Category         Result (mcg/mg creatinine) . Normal                    <30 Microalbuminuria         30-299  Clinical albuminuria   > OR = 300 . The ADA recommends that at least two of three specimens collected within a 3-6 month period be abnormal before considering a patient to be within a diagnostic category.   Urinalysis, Routine w reflex microscopic     Status: None   Collection Time: 07/31/18  9:04 AM  Result Value Ref Range   Color, Urine YELLOW YELLOW   APPearance CLEAR CLEAR   Specific Gravity, Urine 1.009 1.001 - 1.03   pH < OR = 5.0 5.0 - 8.0   Glucose, UA NEGATIVE NEGATIVE   Bilirubin Urine NEGATIVE NEGATIVE   Ketones, ur NEGATIVE NEGATIVE   Hgb urine dipstick NEGATIVE NEGATIVE   Protein, ur NEGATIVE NEGATIVE   Nitrite NEGATIVE NEGATIVE   Leukocytes, UA NEGATIVE NEGATIVE  Hemoglobin A1c     Status: Abnormal   Collection Time: 07/31/18  9:04 AM  Result Value Ref Range   Hgb A1c MFr Bld 6.8 (H) 4.6 - 6.5 %    Comment: Glycemic Control Guidelines for People with  Diabetes:Non Diabetic:  <6%Goal of Therapy: <7%Additional Action Suggested:  >8%   Lipid panel     Status: Abnormal   Collection Time: 07/31/18  9:04 AM  Result Value Ref Range   Cholesterol 183 0 - 200 mg/dL    Comment: ATP III Classification       Desirable:  < 200 mg/dL               Borderline High:  200 - 239 mg/dL          High:  > = 240 mg/dL   Triglycerides 68.0 0.0 - 149.0 mg/dL  Comment: Normal:  <150 mg/dLBorderline High:  150 - 199 mg/dL   HDL 44.70 >39.00 mg/dL   VLDL 13.6 0.0 - 40.0 mg/dL   LDL Cholesterol 125 (H) 0 - 99 mg/dL   Total CHOL/HDL Ratio 4     Comment:                Men          Women1/2 Average Risk     3.4          3.3Average Risk          5.0          4.42X Average Risk          9.6          7.13X Average Risk          15.0          11.0                       NonHDL 138.14     Comment: NOTE:  Non-HDL goal should be 30 mg/dL higher than patient's LDL goal (i.e. LDL goal of < 70 mg/dL, would have non-HDL goal of < 100 mg/dL)  CBC with Differential/Platelet     Status: Abnormal   Collection Time: 07/31/18  9:04 AM  Result Value Ref Range   WBC 6.0 4.0 - 10.5 K/uL   RBC 4.20 3.87 - 5.11 Mil/uL   Hemoglobin 11.9 (L) 12.0 - 15.0 g/dL   HCT 35.8 (L) 36.0 - 46.0 %   MCV 85.1 78.0 - 100.0 fl   MCHC 33.2 30.0 - 36.0 g/dL   RDW 14.9 11.5 - 15.5 %   Platelets 282.0 150.0 - 400.0 K/uL   Neutrophils Relative % 45.8 43.0 - 77.0 %   Lymphocytes Relative 45.8 12.0 - 46.0 %   Monocytes Relative 4.9 3.0 - 12.0 %   Eosinophils Relative 2.6 0.0 - 5.0 %   Basophils Relative 0.9 0.0 - 3.0 %   Neutro Abs 2.7 1.4 - 7.7 K/uL   Lymphs Abs 2.7 0.7 - 4.0 K/uL   Monocytes Absolute 0.3 0.1 - 1.0 K/uL   Eosinophils Absolute 0.2 0.0 - 0.7 K/uL   Basophils Absolute 0.1 0.0 - 0.1 K/uL  Comprehensive metabolic panel     Status: Abnormal   Collection Time: 07/31/18  9:04 AM  Result Value Ref Range   Sodium 138 135 - 145 mEq/L   Potassium 3.3 (L) 3.5 - 5.1 mEq/L   Chloride 101 96 -  112 mEq/L   CO2 27 19 - 32 mEq/L   Glucose, Bld 95 70 - 99 mg/dL   BUN 16 6 - 23 mg/dL   Creatinine, Ser 0.99 0.40 - 1.20 mg/dL   Total Bilirubin 0.3 0.2 - 1.2 mg/dL   Alkaline Phosphatase 54 39 - 117 U/L   AST 17 0 - 37 U/L   ALT 26 0 - 35 U/L   Total Protein 6.9 6.0 - 8.3 g/dL   Albumin 4.1 3.5 - 5.2 g/dL   Calcium 8.9 8.4 - 10.5 mg/dL   GFR 75.94 >60.00 mL/min  Cytology - PAP     Status: None   Collection Time: 08/25/18 12:00 AM  Result Value Ref Range   Adequacy      Satisfactory for evaluation  endocervical/transformation zone component PRESENT.   Diagnosis      NEGATIVE FOR INTRAEPITHELIAL LESIONS OR MALIGNANCY.   HPV NOT DETECTED  Comment: Normal Reference Range - NOT Detected   Material Submitted CervicoVaginal Pap [ThinPrep Imaged]    CYTOLOGY - PAP PAP RESULT   Glucose, capillary     Status: Abnormal   Collection Time: 09/15/18  7:25 AM  Result Value Ref Range   Glucose-Capillary 127 (H) 70 - 99 mg/dL   Comment 1 IN EPIC   TSH     Status: None   Collection Time: 10/08/18 10:12 AM  Result Value Ref Range   TSH 0.51 0.35 - 4.50 uIU/mL  T4, free     Status: None   Collection Time: 10/08/18 10:12 AM  Result Value Ref Range   Free T4 1.02 0.60 - 1.60 ng/dL    Comment: Specimens from patients who are undergoing biotin therapy and /or ingesting biotin supplements may contain high levels of biotin.  The higher biotin concentration in these specimens interferes with this Free T4 assay.  Specimens that contain high levels  of biotin may cause false high results for this Free T4 assay.  Please interpret results in light of the total clinical presentation of the patient.     Objective  Body mass index is 36.95 kg/m. Wt Readings from Last 3 Encounters:  10/09/18 250 lb 3.2 oz (113.5 kg)  10/08/18 250 lb 9.6 oz (113.7 kg)  09/15/18 240 lb (108.9 kg)   Temp Readings from Last 3 Encounters:  10/09/18 98 F (36.7 C) (Oral)  09/15/18 (!) 97.5 F (36.4 C) (Tympanic)   08/25/18 98.3 F (36.8 C) (Oral)   BP Readings from Last 3 Encounters:  10/09/18 122/78  10/08/18 (!) 140/96  09/15/18 (!) 125/96   Pulse Readings from Last 3 Encounters:  10/09/18 88  10/08/18 75  09/15/18 63    Physical Exam  Constitutional: She is oriented to person, place, and time. Vital signs are normal. She appears well-developed and well-nourished. She is cooperative.  HENT:  Head: Normocephalic and atraumatic.  Mouth/Throat: Oropharynx is clear and moist and mucous membranes are normal.  Eyes: Pupils are equal, round, and reactive to light. Conjunctivae are normal.  Cardiovascular: Normal rate, regular rhythm and normal heart sounds.  Pulmonary/Chest: Effort normal and breath sounds normal. She has no wheezes.  Neurological: She is alert and oriented to person, place, and time. Gait normal.  Skin: Skin is warm, dry and intact.  Psychiatric: She has a normal mood and affect. Her behavior is normal. Judgment and thought content normal. Cognition and memory are normal.  Nursing note and vitals reviewed.   Assessment   1. URI/cough with h/o smoking Plan  1. Cold handout  Zpack, tussionex   Provider: Dr. Olivia Mackie McLean-Scocuzza-Internal Medicine

## 2018-10-12 ENCOUNTER — Ambulatory Visit: Payer: 59

## 2018-10-14 ENCOUNTER — Ambulatory Visit: Payer: 59

## 2018-10-21 ENCOUNTER — Other Ambulatory Visit: Payer: Self-pay | Admitting: Internal Medicine

## 2018-10-21 DIAGNOSIS — M542 Cervicalgia: Secondary | ICD-10-CM

## 2018-10-21 DIAGNOSIS — M549 Dorsalgia, unspecified: Secondary | ICD-10-CM

## 2018-10-21 DIAGNOSIS — G8929 Other chronic pain: Secondary | ICD-10-CM

## 2018-10-21 MED ORDER — CYCLOBENZAPRINE HCL 5 MG PO TABS: 5.0000 mg | ORAL_TABLET | Freq: Every evening | ORAL | 2 refills | Status: DC | PRN

## 2018-12-01 ENCOUNTER — Ambulatory Visit: Payer: 59 | Admitting: Internal Medicine

## 2018-12-24 ENCOUNTER — Other Ambulatory Visit: Payer: Self-pay | Admitting: Internal Medicine

## 2018-12-24 ENCOUNTER — Encounter: Payer: Self-pay | Admitting: Internal Medicine

## 2018-12-24 ENCOUNTER — Ambulatory Visit (INDEPENDENT_AMBULATORY_CARE_PROVIDER_SITE_OTHER): Payer: No Typology Code available for payment source | Admitting: Internal Medicine

## 2018-12-24 ENCOUNTER — Other Ambulatory Visit: Payer: Self-pay

## 2018-12-24 VITALS — BP 122/78 | HR 78 | Temp 97.8°F | Ht 69.0 in | Wt 251.2 lb

## 2018-12-24 DIAGNOSIS — F419 Anxiety disorder, unspecified: Secondary | ICD-10-CM

## 2018-12-24 DIAGNOSIS — I10 Essential (primary) hypertension: Secondary | ICD-10-CM

## 2018-12-24 DIAGNOSIS — Z85850 Personal history of malignant neoplasm of thyroid: Secondary | ICD-10-CM

## 2018-12-24 DIAGNOSIS — E119 Type 2 diabetes mellitus without complications: Secondary | ICD-10-CM | POA: Diagnosis not present

## 2018-12-24 DIAGNOSIS — F329 Major depressive disorder, single episode, unspecified: Secondary | ICD-10-CM

## 2018-12-24 DIAGNOSIS — R232 Flushing: Secondary | ICD-10-CM

## 2018-12-24 DIAGNOSIS — M542 Cervicalgia: Secondary | ICD-10-CM

## 2018-12-24 DIAGNOSIS — M549 Dorsalgia, unspecified: Secondary | ICD-10-CM

## 2018-12-24 DIAGNOSIS — E785 Hyperlipidemia, unspecified: Secondary | ICD-10-CM

## 2018-12-24 DIAGNOSIS — C73 Malignant neoplasm of thyroid gland: Secondary | ICD-10-CM

## 2018-12-24 DIAGNOSIS — E1165 Type 2 diabetes mellitus with hyperglycemia: Secondary | ICD-10-CM

## 2018-12-24 DIAGNOSIS — G8929 Other chronic pain: Secondary | ICD-10-CM

## 2018-12-24 DIAGNOSIS — K219 Gastro-esophageal reflux disease without esophagitis: Secondary | ICD-10-CM

## 2018-12-24 DIAGNOSIS — G4700 Insomnia, unspecified: Secondary | ICD-10-CM

## 2018-12-24 MED ORDER — HYDROCHLOROTHIAZIDE 25 MG PO TABS: 25.0000 mg | ORAL_TABLET | Freq: Every day | ORAL | 3 refills | Status: DC

## 2018-12-24 MED ORDER — CYCLOBENZAPRINE HCL 5 MG PO TABS: 5.0000 mg | ORAL_TABLET | Freq: Every evening | ORAL | 11 refills | Status: DC | PRN

## 2018-12-24 MED ORDER — VENLAFAXINE HCL ER 75 MG PO CP24: 75.0000 mg | ORAL_CAPSULE | Freq: Every day | ORAL | 3 refills | Status: DC

## 2018-12-24 MED ORDER — METFORMIN HCL 500 MG PO TABS: 500.0000 mg | ORAL_TABLET | Freq: Two times a day (BID) | ORAL | 3 refills | Status: DC

## 2018-12-24 MED ORDER — OMEPRAZOLE 40 MG PO CPDR: 40.0000 mg | DELAYED_RELEASE_CAPSULE | Freq: Every day | ORAL | 3 refills | Status: DC

## 2018-12-24 MED ORDER — TRAZODONE HCL 100 MG PO TABS: 100.0000 mg | ORAL_TABLET | Freq: Every day | ORAL | 3 refills | Status: DC

## 2018-12-24 MED ORDER — ATORVASTATIN CALCIUM 10 MG PO TABS: 10.0000 mg | ORAL_TABLET | Freq: Every day | ORAL | 3 refills | Status: DC

## 2018-12-24 MED ORDER — ENALAPRIL MALEATE 10 MG PO TABS: 10.0000 mg | ORAL_TABLET | Freq: Every day | ORAL | 3 refills | Status: DC

## 2018-12-24 NOTE — Progress Notes (Signed)
Advise patient per endocrine   She should take her thyroid medicine when she wakes up from her sleep. 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

## 2018-12-24 NOTE — Progress Notes (Signed)
Chief Complaint  Patient presents with  . Follow-up   F/u  1. HTN controlled on vasotec 10 mg qd and hctz 25 mg qd  2. Thyroid cancer and DM 2 last A1C 6.8 on metformin 500 mg bid appt endocrine Leb 01/11/2019 9:15 am pt works 3rd shift and wants to know what time should take thyroid medication 3. Hot flashes/anxiety/depression on effexor 75 mg qd and helping with hot flashes she still has anxiety and takes prn xanax    Review of Systems  Constitutional: Negative for weight loss.  HENT: Negative for hearing loss.   Eyes: Negative for blurred vision.  Respiratory: Negative for shortness of breath.   Cardiovascular: Negative for chest pain.  Gastrointestinal: Negative for abdominal pain.  Musculoskeletal: Negative for falls.  Skin: Negative for rash.  Neurological: Negative for headaches.  Psychiatric/Behavioral: Negative for depression.   Past Medical History:  Diagnosis Date  . Anxiety   . Asthma   . 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 Colon polyps   . Depression   . Diabetes mellitus without complication ([place])   . GERD (gastroesophageal reflux disease)   . Herpes    1+2  . Hyperlipidemia   . Hypertension   . OSA on CPAP   . Thyroid disease    Past Surgical History:  Procedure Laterality Date  . COLONOSCOPY WITH PROPOFOL N/A 09/15/2018   Procedure: COLONOSCOPY WITH PROPOFOL;  Surgeon: [name], MD;  Location: [facility] ENDOSCOPY;  Service: Endoscopy;  Laterality: N/A;  . DILATION AND CURETTAGE OF UTERUS    . LEEP    . SALIVARY GLAND SURGERY     left   . THYROIDECTOMY    . TUBAL LIGATION     Family History  Problem Relation Age of Onset  . Cancer Mother        breast  . Diabetes Mother   . Heart disease Mother        CHF  . Hypertension Mother   . Hyperparathyroidism Mother   . Diabetes Father   . Heart disease Father        CHF  . Hypertension Father   . Thyroid nodules Daughter   . Hypertension Son        ?  . Stroke Maternal Grandmother   . Cancer Maternal Grandmother        colon   . Cancer Paternal Grandfather        thyroid  . Diabetes Daughter        2   Social History   Socioeconomic History  . Marital status: Legally Separated    Spouse name: Not on file  . Number of children: Not on file  . Years of education: Not on file  . Highest education level: Not on file  Occupational History  . Not on file  Social Needs  . Financial resource strain: Not on file  . Food insecurity:    Worry: Not on file    Inability: Not on file  . Transportation needs:    Medical: Not on file    Non-medical: Not on file  Tobacco Use  . Smoking status: Current Every Day Smoker  . Smokeless tobacco: Never Used  . Tobacco comment: 1/2 ppd   Substance and Sexual Activity  . Alcohol use: Yes  . Drug use: Not Currently  . Sexual activity: Yes    Comment: men  Lifestyle  . Physical activity:  Days per week: Not on file    Minutes per session: Not on file  . Stress: Not on file  Relationships  . Social connections:    Talks on phone: Not on file    Gets together: Not on file    Attends religious service: Not on file    Active member of club or organization: Not on file    Attends meetings of clubs or organizations: Not on file    Relationship status: Not on file  . Intimate partner violence:    Fear of current or ex partner: Not on file    Emotionally abused: Not on file    Physically abused: Not on file    Forced sexual activity: Not on file  Other Topics Concern  . 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 albuterol (PROVENTIL HFA;VENTOLIN HFA) 108 (90 Base) MCG/ACT inhaler Inhale into the lungs.  . ALPRAZolam (XANAX) 1 MG tablet Take 1 tablet (1 mg total) by mouth daily as needed for anxiety.  . Blood Glucose Monitoring Suppl (GLUCOCOM BLOOD GLUCOSE MONITOR) DEVI Ok  to fill per pt's insurance formulary  . Cholecalciferol 50000 units capsule Take 1 capsule (50,000 Units total) by mouth once a week.  . Dulaglutide 0.75 MG/0.5ML SOPN Inject 0.75 mg into the skin once a week.   Marland Kitchen FREESTYLE LITE test strip   . Lancets (FREESTYLE) lancets   . levothyroxine (SYNTHROID, LEVOTHROID) 150 MCG tablet   . [DISCONTINUED] atorvastatin (LIPITOR) 10 MG tablet Take 1 tablet (10 mg total) by mouth daily at 6 PM.  . [DISCONTINUED] cyclobenzaprine (FLEXERIL) 5 MG tablet Take 1 tablet (5 mg total) by mouth at bedtime as needed for muscle spasms.  . [DISCONTINUED] enalapril (VASOTEC) 10 MG tablet Take 10 mg by mouth daily.   . [DISCONTINUED] hydrochlorothiazide (HYDRODIURIL) 25 MG tablet Take 25 mg by mouth daily.   . [DISCONTINUED] metFORMIN (GLUCOPHAGE) 500 MG tablet Take 500 mg by mouth 2 (two) times daily with a meal.   . [DISCONTINUED] omeprazole (PRILOSEC) 40 MG capsule Take by mouth.  . [DISCONTINUED] propranolol (INDERAL) 10 MG tablet Take by mouth.  . [DISCONTINUED] traZODone (DESYREL) 100 MG tablet Take 100 mg by mouth at bedtime.   . [DISCONTINUED] venlafaxine XR (EFFEXOR-XR) 75 MG 24 hr capsule Take 1 capsule (75 mg total) by mouth daily with breakfast.   No Known Allergies Recent Results (from the past 2160 hour(s))  Thyroglobulin antibody     Status: None   Collection Time: 10/08/18 10:12 AM  Result Value Ref Range   Thyroglobulin Ab <1 < or = 1 IU/mL  Thyroglobulin Level     Status: Abnormal   Collection Time: 10/08/18 10:12 AM  Result Value Ref Range   Thyroglobulin 1.1 (L) ng/mL    Comment:       Reference Range:       Intact Thyroid   2.8-40.9       Athyrotic        <0.1 .       Note: Abnormal flagging is based       on the reference interval for        patients with intact thyroid. . . This test was performed using the Beckman Coulter  chemiluminescent method. Values obtained from different assay methods cannot be used interchangeably.  Thyroglobulin levels,  regardless of value, should not be interpreted as absolute evidence of the presence or absence of disease. .    Comment      Comment: . Thyroglobulin antibodies (TGAB) interfere with thyroglobulin (TG) assays; therefore, TGAB assay should always be performed in conjunction with a TG assay. .   TSH     Status: None   Collection Time: 10/08/18 10:12 AM  Result Value Ref Range   TSH 0.51 0.35 - 4.50 uIU/mL  T4, free     Status: None   Collection Time: 10/08/18 10:12 AM  Result Value Ref Range   Free T4 1.02 0.60 - 1.60 ng/dL    Comment: Specimens from patients who are undergoing biotin therapy and /or ingesting biotin supplements may contain high levels of biotin.  The higher biotin concentration in these specimens interferes with this Free T4 assay.  Specimens that contain high levels  of biotin may cause false high results for this Free T4 assay.  Please interpret results in light of the total clinical presentation of the patient.     Objective  Body mass index is 37.1 kg/m. Wt Readings from Last 3 Encounters:  12/24/18 251 lb 3.2 oz (113.9 kg)  10/09/18 250 lb 3.2 oz (113.5 kg)  10/08/18 250 lb 9.6 oz (113.7 kg)   Temp Readings from Last 3 Encounters:  12/24/18 97.8 F (36.6 C) (Oral)  10/09/18 98 F (36.7 C) (Oral)  09/15/18 (!) 97.5 F (36.4 C) (Tympanic)   BP Readings from Last 3 Encounters:  12/24/18 122/78  10/09/18 122/78  10/08/18 (!) 140/96   Pulse Readings from Last 3 Encounters:  12/24/18 78  10/09/18 88  10/08/18 75    Physical Exam Vitals signs and nursing note reviewed.  Constitutional:      Appearance: Normal appearance. She is well-developed. She is obese.  HENT:     Head: Normocephalic and atraumatic.     Ears:     Comments: Cerumen in ears w/o impaction      Nose: Nose normal.     Mouth/Throat:     Mouth: Mucous membranes are moist.     Pharynx: Oropharynx is clear.  Eyes:     Conjunctiva/sclera: Conjunctivae  normal.     Pupils: Pupils are equal, round, and reactive to light.  Cardiovascular:     Rate and Rhythm: Normal rate and regular rhythm.     Heart sounds: Normal heart sounds. No murmur.  Pulmonary:     Effort: Pulmonary effort is normal.     Breath sounds: Normal breath sounds.  Skin:    General: Skin is warm and dry.  Neurological:     General: No focal deficit present.     Mental Status: She is alert and oriented to person, place, and time. Mental status is at baseline.     Gait: Gait normal.  Psychiatric:        Attention and Perception: Attention and perception normal.        Mood and Affect: Mood and affect normal.        Speech: Speech normal.        Behavior: Behavior normal. Behavior is cooperative.        Thought Content: Thought content normal.        Cognition and Memory: Cognition and memory normal.        Judgment: Judgment normal.     Assessment   1. HTN/HLD 2. Thyroid caner history, DM 2  3. Hot flashes/anxiety/depression and stressors. Hot flashes improved  on meds  4. HM Plan   1.  Cont meds  2. F/u endocrine 01/11/2019 pending labs Will ask endocrine if GLP 1 ok to continue with ho thyroid cancer?  Pt wants to know best time to take thyroid medications working 3rd shift?  CC Dr. Kelton Pillar Cont meds  Foot exam at f/u  Eye exam patty vision due summer 2020 pt to call back when ready for appt 3.  Cont effexor and prn xanax helping  4.  Flu shot had 07/2018 Tdap had 12/08/15  Will check on pna 23 vaccine? Had 11/18/12  -pna 23 given today  Hep A/B vx hadhep B immune  Disc shingrix vaccinehold vaccine pt wants to think about it  Per pt check MMR and immune  Due colonoscopy h/o polypsnoted 09/18/15 pt wants repeat due to [place] mGM colon cancer  -09/15/18 normal repeat in 5 years with FH Pap at f/u h/o abnormal pap and LEEP -08/25/18 negative negative HPV Mammogram2/15/19 negreferred prev pt to schedule  Smoker 1/2 ppd rec cessation  Provider:  Dr. Olivia Mackie McLean-Scocuzza-Internal Medicine

## 2018-12-24 NOTE — Patient Instructions (Signed)
Debrox ear drops   Earwax Buildup, Adult The ears produce a substance called earwax that helps keep bacteria out of the ear and protects the skin in the ear canal. Occasionally, earwax can build up in the ear and cause discomfort or hearing loss. What increases the risk? This condition is more likely to develop in people who:  Are female.  Are elderly.  Naturally produce more earwax.  Clean their ears often with cotton swabs.  Use earplugs often.  Use in-ear headphones often.  Wear hearing aids.  Have narrow ear canals.  Have earwax that is overly thick or sticky.  Have eczema.  Are dehydrated.  Have excess hair in the ear canal. What are the signs or symptoms? Symptoms of this condition include:  Reduced or muffled hearing.  A feeling of fullness in the ear or feeling that the ear is plugged.  Fluid coming from the ear.  Ear pain.  Ear itch.  Ringing in the ear.  Coughing.  An obvious piece of earwax that can be seen inside the ear canal. How is this diagnosed? This condition may be diagnosed based on:  Your symptoms.  Your medical history.  An ear exam. During the exam, your health care provider will look into your ear with an instrument called an otoscope. You may have tests, including a hearing test. How is this treated? This condition may be treated by:  Using ear drops to soften the earwax.  Having the earwax removed by a health care provider. The health care provider may: ? Flush the ear with water. ? Use an instrument that has a loop on the end (curette). ? Use a suction device.  Surgery to remove the wax buildup. This may be done in severe cases. Follow these instructions at home:   Take over-the-counter and prescription medicines only as told by your health care provider.  Do not put any objects, including cotton swabs, into your ear. You can clean the opening of your ear canal with a washcloth or facial tissue.  Follow instructions  from your health care provider about cleaning your ears. Do not over-clean your ears.  Drink enough fluid to keep your urine clear or pale yellow. This will help to thin the earwax.  Keep all follow-up visits as told by your health care provider. If earwax builds up in your ears often or if you use hearing aids, consider seeing your health care provider for routine, preventive ear cleanings. Ask your health care provider how often you should schedule your cleanings.  If you have hearing aids, clean them according to instructions from the manufacturer and your health care provider. Contact a health care provider if:  You have ear pain.  You develop a fever.  You have blood, pus, or other fluid coming from your ear.  You have hearing loss.  You have ringing in your ears that does not go away.  Your symptoms do not improve with treatment.  You feel like the room is spinning (vertigo). Summary  Earwax can build up in the ear and cause discomfort or hearing loss.  The most common symptoms of this condition include reduced or muffled hearing and a feeling of fullness in the ear or feeling that the ear is plugged.  This condition may be diagnosed based on your symptoms, your medical history, and an ear exam.  This condition may be treated by using ear drops to soften the earwax or by having the earwax removed by a health care provider.  Do not put any objects, including cotton swabs, into your ear. You can clean the opening of your ear canal with a washcloth or facial tissue. This information is not intended to replace advice given to you by your health care provider. Make sure you discuss any questions you have with your health care provider. Document Released: 12/12/2004 Document Revised: 10/16/2017 Document Reviewed: 01/15/2017 Elsevier Interactive Patient Education  2019 [employer].

## 2018-12-25 ENCOUNTER — Telehealth: Payer: Self-pay

## 2018-12-25 NOTE — Telephone Encounter (Signed)
-----   Message from Delorise Jackson, MD sent at 12/24/2018  6:26 PM EST -----   ----- Message ----- From: Cloyd Stagers, MD Sent: 12/24/2018   4:47 PM EST To: Nino Glow McLean-Scocuzza, MD  GLP- 1 is ok with Hx of papillary thyroid cancer its only contra-indicated if she has personal or FH of medullary thyroid cancer.   She should take her thyroid medicine when she wakes up from her sleep. (  I am assuming if she works a 3rd shift, she sleep in the morning till the afternoon, so  she takes it when she wakes up in the afternoon.    Please let me know if you have any other questions.    Thanks   Abby  ----- Message ----- From: McLean-Scocuzza, Nino Glow, MD Sent: 12/24/2018  10:13 AM EST To: Cloyd Stagers, MD  Dr. Kelton Pillar see A/P please I have questions  Melissa appt 2/24 leb endocrine pls make referral for focus plan

## 2018-12-25 NOTE — Telephone Encounter (Signed)
Left message to call back  

## 2018-12-25 NOTE — Telephone Encounter (Signed)
Left message for patient to return call back. P[facility] may give  Information on the below.  Advise patient per endocrine   She should take her thyroid medicine when she wakes up from her sleep. ( I am assuming if she works a 3rd shift, she sleep in the morning till the afternoon, so she takes it when she wakes up in the afternoon  This is what her thyroid doctor replied to me but you can discuss at upcoming appt too

## 2018-12-25 NOTE — Progress Notes (Signed)
Referral has been made. Pt calls or uses the [employer] app to request referral.

## 2019-01-11 ENCOUNTER — Encounter: Payer: Self-pay | Admitting: Internal Medicine

## 2019-01-11 ENCOUNTER — Ambulatory Visit: Payer: No Typology Code available for payment source | Admitting: Internal Medicine

## 2019-01-11 ENCOUNTER — Other Ambulatory Visit: Payer: Self-pay

## 2019-01-11 VITALS — BP 136/92 | HR 94 | Ht 69.0 in | Wt 248.2 lb

## 2019-01-11 DIAGNOSIS — E890 Postprocedural hypothyroidism: Secondary | ICD-10-CM | POA: Insufficient documentation

## 2019-01-11 DIAGNOSIS — C73 Malignant neoplasm of thyroid gland: Secondary | ICD-10-CM | POA: Insufficient documentation

## 2019-01-11 DIAGNOSIS — E119 Type 2 diabetes mellitus without complications: Secondary | ICD-10-CM

## 2019-01-11 LAB — T4, FREE: Free T4: 1.09 ng/dL (ref 0.60–1.60)

## 2019-01-11 LAB — POCT GLYCOSYLATED HEMOGLOBIN (HGB A1C): HEMOGLOBIN A1C: 6.8 % — AB (ref 4.0–5.6)

## 2019-01-11 LAB — TSH: TSH: 0.31 u[IU]/mL — AB (ref 0.35–4.50)

## 2019-01-11 NOTE — Patient Instructions (Signed)
-   You are on levothyroxine - which is your thyroid hormone supplement. You MUST take this consistently.  You should take this first thing in the morning on an empty stomach with water. You should not take it with other medications. Wait 65min to 1hr prior to eating. If you are taking any vitamins - please take these in the evening.   If you miss a dose, please take your missed dose the following day (double the dose for that day). You should have a pill box for ONLY levothyroxine on your bedside table to help you remember to take your medications.   - Continue Metformin 500 mg Twice a day with meals - Continue Trulicity at 5.85 mg weekly

## 2019-01-11 NOTE — Progress Notes (Signed)
Name: Kerry Davis  MRN/ DOB: [id number], [birth year]    Age/ Sex: 53 y.o., female     PCP: McLean-Scocuzza, Kerry Glow, MD   Reason for Endocrinology Evaluation: Hx of papillary thyroid cancer      Initial Endocrinology Clinic Visit: 10/08/2018    PATIENT IDENTIFIER: Kerry Davis is a 53 y.o., female with a past medical history of spondylolesthesis, asthma, depression, T2DM, GAD, HTN, papillary thyroid cancer She has followed with [facility] since 10/08/2018 for consultative assistance with management of her T2DM and papillary thyroid carcinoma.   THYROID HISTORICAL SUMMARY: She was diagnosed with follicular and papillary variant in 2011 12/2009 right lobectomy, tumor 1.6 cm, infiltrative and involved margins, one L.N negative) . 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, follicular subtype) 06/8501 99 mCi RAI  04/2010 post therapy whole body scan with uptake in the neck  06/2012 U/S revealed an enlarged level 6 L.N 06/2012 surgical resection of recurrent metastatic papillary cancer 3/3 (largest 2.2 cm) 08/2012 149 mCi RAI  09/2012 post-therapy WBS negative for abnormal uptake  Tg remained low through out  10/2013 Thyrogen stimulated WBS was negative and Tg 0.2, PET showed + neck L.N  11/2013 ultrasound stable 05/2014 U/S stable with sub-centimeter nodes 10/2014 I-131 WBS negative, PET scan stable "Grossly unchanged appearance of multiple bilateral 3-6 mm mildly FDG avid cervical lymph nodes 05/2015 : U/S no evidence of residual thyroid tissue , unremarkable L.N . No lesion requiring biopsy 08/2016 Thyrogen stimulated TG was 1.5 (negative) per  records.   DIABETES HISTORICAL HISTORY : She was diagnosed with T2DM in 2011.  On her initial presentation to our clinic she was on metformin and Trulicity, she is to be on insulin in the past but has been off due to lifestyle changes.Hemoglobin A1c has ranged from 6.9% in 2019, peaking  at 15.0% in 2011  SUBJECTIVE:   During last visit (10/08/2018): Her TSH was within goal, thyroglobulin antibody was undetectable, TG level was 1.1 ng/dL.  Her A1c was 6.8% we continued metformin and Trulicity.  We will continue levothyroxine at 150 MCG daily.  Today (01/12/2019):  [name] is here for 3 month folllow up on diabetes and Hx of papillary thyroid cancer.   She checks sugar twice a day ( fasting ) low 100's mg/dL.  She denies any hypoglycemic episodes since her last office visit.  She did not bring her meter today.  Denies any local neck symptoms.  Has chronic constipation  and chronic heat intoerance, denies depression and anxiety. Denies palpitations, or chest pain.   Continues to work 3rd shift.     ROS:  As per HPI.  HOME REGIMEN: Levothyroxine 150 mcg daily  Metformin 774 mg BID Trulicity 1.28 mg weekly   Statin: Yes ACE-I/ARB: yes Prior Diabetic Education: No     DIABETIC COMPLICATIONS: Microvascular complications:    Denies: retinopathy, nephropathy, neuropathy  Last eye exam: Completed 2019  Macrovascular complications:    Denies: CAD, PVD, CVA     HISTORY:  Past Medical History:  Past Medical History:  Diagnosis Date  . Anxiety   . Asthma   . Cancer ([place])    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 saliva production   . Colon polyps   . Depression   . Diabetes mellitus without complication ([place])   . GERD (gastroesophageal reflux disease)   . Herpes    1+2  . Hyperlipidemia   . Hypertension   . OSA  on CPAP   . Thyroid disease    Past Surgical History:  Past Surgical History:  Procedure Laterality Date  . COLONOSCOPY WITH PROPOFOL N/A 09/15/2018   Procedure: COLONOSCOPY WITH PROPOFOL;  Surgeon: [name], MD;  Location: [facility] ENDOSCOPY;  Service: Endoscopy;  Laterality: N/A;  . DILATION AND CURETTAGE OF UTERUS    . LEEP    . SALIVARY GLAND SURGERY     left   . THYROIDECTOMY    . TUBAL LIGATION       Social History:  reports that she has been smoking. She has never used smokeless tobacco. She reports current alcohol use. She reports previous drug use. Family History:  Family History  Problem Relation Age of Onset  . Cancer Mother        breast  . Diabetes Mother   . Heart disease Mother        CHF  . Hypertension Mother   . Hyperparathyroidism Mother   . Diabetes Father   . Heart disease Father        CHF  . Hypertension Father   . Thyroid nodules Daughter   . Hypertension Son        ?  . Stroke Maternal Grandmother   . Cancer Maternal Grandmother        colon   . Cancer Paternal Grandfather        thyroid  . Diabetes Daughter        2     HOME MEDICATIONS: Allergies as of 01/11/2019   No Known Allergies     Medication List       Accurate as of January 11, 2019 11:59 PM. Always use your most recent med list.        albuterol 108 (90 Base) MCG/ACT inhaler Commonly known as:  PROVENTIL HFA;VENTOLIN HFA Inhale into the lungs.   ALPRAZolam 1 MG tablet Commonly known as:  XANAX Take 1 tablet (1 mg total) by mouth daily as needed for anxiety.   atorvastatin 10 MG tablet Commonly known as:  LIPITOR Take 1 tablet (10 mg total) by mouth daily at 6 PM.   Cholecalciferol 1.25 MG (50000 UT) capsule Take 1 capsule (50,000 Units total) by mouth once a week.   cyclobenzaprine 5 MG tablet Commonly known as:  FLEXERIL Take 1 tablet (5 mg total) by mouth at bedtime as needed for muscle spasms.   Dulaglutide 0.75 MG/0.5ML Sopn Inject 0.75 mg into the skin once a week.   enalapril 10 MG tablet Commonly known as:  VASOTEC Take 1 tablet (10 mg total) by mouth daily.   freestyle lancets   FREESTYLE LITE test strip Generic drug:  glucose blood   GLUCOCOM BLOOD GLUCOSE MONITOR Devi Ok to fill per pt's insurance formulary   hydrochlorothiazide 25 MG tablet Commonly known as:  HYDRODIURIL Take 1 tablet (25 mg total) by mouth daily.   levothyroxine 150 MCG  tablet Commonly known as:  SYNTHROID, LEVOTHROID   metFORMIN 500 MG tablet Commonly known as:  GLUCOPHAGE Take 1 tablet (500 mg total) by mouth 2 (two) times daily with a meal.   omeprazole 40 MG capsule Commonly known as:  PRILOSEC Take 1 capsule (40 mg total) by mouth daily.   traZODone 100 MG tablet Commonly known as:  DESYREL Take 1 tablet (100 mg total) by mouth at bedtime.   venlafaxine XR 75 MG 24 hr capsule Commonly known as:  EFFEXOR-XR Take 1 capsule (75 mg total) by mouth daily with breakfast.  OBJECTIVE:   PHYSICAL EXAM: VS: BP (!) 136/92 (BP Location: Left Arm, Patient Position: Sitting, Cuff Size: Normal)   Pulse 94   Ht 5\' 9"  (1.753 m)   Wt 248 lb 3.2 oz (112.6 kg)   SpO2 94%   BMI 36.65 kg/m    EXAM: General: Pt appears well and is in NAD  Neck: General: Supple without adenopathy. Thyroid: Thyroid size normal.  No goiter or nodules appreciated. No thyroid bruit.  Lungs: Clear with good BS bilat with no rales, rhonchi, or wheezes  Heart: Auscultation: RRR.  Abdomen: Normoactive bowel sounds, soft, nontender, without masses or organomegaly palpable  Extremities:  BL LE: No pretibial edema normal ROM and strength.  Skin: Hair: Texture and amount normal with gender appropriate distribution Skin Inspection: No rashes, acanthosis nigricans/skin tags. No lipohypertrophy Skin Palpation: Skin temperature, texture, and thickness normal to palpation  Mental Status: Judgment, insight: Intact Orientation: Oriented to time, place, and person Memory: Intact for recent and remote events Mood and affect: No depression, anxiety, or agitation     DATA REVIEWED:  Results for PADEN, KURAS (MRN [id number]) as of 01/12/2019 12:39  Ref. Range 01/11/2019 09:28 01/11/2019 09:48  Hemoglobin A1C Latest Ref Range: 4.0 - 5.6 % 6.8 (A)   TSH Latest Ref Range: 0.35 - 4.50 uIU/mL  0.31 (L)  T4,Free(Direct) Latest Ref Range: 0.60 - 1.60 ng/dL  1.09      ASSESSMENT / PLAN / RECOMMENDATIONS:   1. Hx of Papillary Thyroid Cancer: S/P thyroidectomy and RAI ablation x 2 (2011 &2013):  - Pt has metastatic recurrent to L.N in 2013. Post-treatment scan has been negative - Pt is clinically euthyroid - Repeat TSH today within the goal of 0.5-2.0 uIu/mL , awaiting on Tg and Tg Ab.  - As per her previous endocrinologist she will need another Thyrogen stimulated tumor marker in 2020. - So  far she has had excellent structural and biochemical response - Repeat TSH today is below goal, but will not change it based on one reading yet. If it continues to be low by next check up, will cut her LT-4 down   - TG and GT Abs level pending.   Medications Continue Levothyroxine 150 mcg daily   2. Post-operative Hypothyroidism : - Clinically euthyroid - TSH goal 0.5-2.0  - Pt educated extensively on the correct way to take levothyroxine (first thing in the morning with water, 30 minutes before eating or taking other medications). - Pt encouraged to double dose the following day if she were to miss a dose given long half-life of levothyroxine.  Medications Levothyroxine 150 mcg daily    3. Type 2 Diabetes Mellitus, excellent control, without complications: Q7Y 1.9%   GENERAL:  Praised patient on medication adherence and dietary discretions.   She denies any side effects from the meds.   Discussed importance of euglycemia in reducing microvascular complications  She was advised to check glucose fasting as much as she can (currently doesn't check )  MEDICATIONS:  Continue Metformin 500 mg BID  Continue Trulicity 5.09 mg weekly   EDUCATION / INSTRUCTIONS:  BG monitoring instructions: Patient is instructed to check her blood sugars 1 times a day, fasting.  Call [facility] if: BG persistently < 70 or > 300.   4) Diabetic complications:   Eye: Does not have known diabetic retinopathy. Last eye exam was within  6 months  Neuro/ Feet: Does not have known diabetic peripheral neuropathy.  Renal: Patient does not have known baseline  CKD. She is on an ACEI/ARB at present.   5) Lipids: Patient is on a statin. LDL is above goal at 125 mg/dL .patient states she is trying to do better with taking her statin.  She was encouraged to continue with compliance, discussed risk of CAD , PVD and CVA.   6) Hypertension: She is not at goal of < 140/90 mmHg. Will defer to PCP. Asymptomatic at this time.    F/U in 3 months   Signed electronically by: Mack Guise, MD  [facility] Endocrinology  [address], [place] [address] Phone: [phone] FAX: [phone]      CC: McLean-Scocuzza, Kerry Glow, MD [place] [address] Phone: [phone]  Fax: [phone]   Return to Endocrinology clinic as below: Future Appointments  Date Time Provider [place]  04/13/2019  9:10 AM Kirstan Fentress, Melanie Crazier, MD [facility] None  07/27/2019  8:00 AM McLean-Scocuzza, Kerry Glow, MD [facility]

## 2019-01-12 LAB — THYROGLOBULIN LEVEL: THYROGLOBULIN: 1.2 ng/mL — AB

## 2019-01-12 LAB — THYROGLOBULIN ANTIBODY

## 2019-01-18 ENCOUNTER — Telehealth: Payer: Self-pay | Admitting: Internal Medicine

## 2019-01-18 DIAGNOSIS — C73 Malignant neoplasm of thyroid gland: Secondary | ICD-10-CM

## 2019-01-18 NOTE — Telephone Encounter (Signed)
Spoke to the patient about her elevated TG level, given her previous endocrinologists recommendations with proceeding with WBS in 2020, I believe this is a good time to proceed with this give that her Tg level has trended up minimally.    Pt agreed to the above       Results for Kerry Davis, Kerry Davis (MRN [id number]) as of 01/18/2019 08:59  Ref. Range 01/11/2019 09:48  TSH Latest Ref Range: 0.35 - 4.50 uIU/mL 0.31 (L)  T4,Free(Direct) Latest Ref Range: 0.60 - 1.60 ng/dL 1.09  Thyroglobulin Latest Units: ng/mL 1.2 (L)  Thyroglobulin Ab Latest Ref Range: < or = 1 IU/mL <1    Recommendations   Thyrogen stimulated WBS , with PET scan if the WBS is negative     Abby Nena Jordan, MD  [facility] Endocrinology  [facility] [address] [place], [place] [address] Phone: [place]: [phone]

## 2019-03-09 ENCOUNTER — Other Ambulatory Visit: Payer: Self-pay | Admitting: Internal Medicine

## 2019-03-09 DIAGNOSIS — F419 Anxiety disorder, unspecified: Secondary | ICD-10-CM

## 2019-03-09 MED ORDER — ALPRAZOLAM 1 MG PO TABS: 1.0000 mg | ORAL_TABLET | Freq: Every day | ORAL | 5 refills | Status: DC | PRN

## 2019-03-09 NOTE — Telephone Encounter (Signed)
Requested medication (s) are due for refill today: yes  Requested medication (s) are on the active medication list:yes  Last refill:  08/25/18  Future visit scheduled: yes  Notes to clinic:  Not delegated    Requested Prescriptions  Pending Prescriptions Disp Refills   ALPRAZolam (XANAX) 1 MG tablet 30 tablet 5    Sig: Take 1 tablet (1 mg total) by mouth daily as needed for anxiety.     Not Delegated - Psychiatry:  Anxiolytics/Hypnotics Failed - 03/09/2019  8:15 AM      Failed - This refill cannot be delegated      Failed - Urine Drug Screen completed in last 360 days.      Passed - Valid encounter within last 6 months    Recent Outpatient Visits          2 months ago Type 2 diabetes mellitus without complication, without long-term current use of insulin ([place])   [place] McLean-Scocuzza, Nino Glow, MD   5 months ago Cough   [facility] McLean-Scocuzza, Nino Glow, MD   6 months ago Essential hypertension   [place], Nino Glow, MD   7 months ago Type 2 diabetes mellitus without complication, without long-term current use of insulin ([place])   [place], Nino Glow, MD      Future Appointments            In 4 months McLean-Scocuzza, Nino Glow, MD [facility], [facility]

## 2019-04-13 ENCOUNTER — Ambulatory Visit: Payer: No Typology Code available for payment source | Admitting: Internal Medicine

## 2019-04-13 ENCOUNTER — Encounter: Payer: Self-pay | Admitting: Internal Medicine

## 2019-04-13 ENCOUNTER — Other Ambulatory Visit: Payer: Self-pay

## 2019-04-13 VITALS — BP 138/102 | HR 91 | Temp 98.2°F | Ht 69.0 in | Wt 242.0 lb

## 2019-04-13 DIAGNOSIS — C73 Malignant neoplasm of thyroid gland: Secondary | ICD-10-CM | POA: Diagnosis not present

## 2019-04-13 DIAGNOSIS — E119 Type 2 diabetes mellitus without complications: Secondary | ICD-10-CM | POA: Diagnosis not present

## 2019-04-13 LAB — POCT GLYCOSYLATED HEMOGLOBIN (HGB A1C): Hemoglobin A1C: 7.5 % — AB (ref 4.0–5.6)

## 2019-04-13 LAB — TSH: TSH: 0.11 u[IU]/mL — ABNORMAL LOW (ref 0.35–4.50)

## 2019-04-13 LAB — GLUCOSE, POCT (MANUAL RESULT ENTRY): POC Glucose: 115 mg/dl — AB (ref 70–99)

## 2019-04-13 LAB — T4, FREE: Free T4: 1.29 ng/dL (ref 0.60–1.60)

## 2019-04-13 NOTE — Patient Instructions (Signed)
-   You are on levothyroxine - which is your thyroid hormone supplement. You MUST take this consistently.  You should take this first thing in the morning on an empty stomach with water. You should not take it with other medications. Wait 18min to 1hr prior to eating. If you are taking any vitamins - please take these in the evening.   If you miss a dose, please take your missed dose the following day (double the dose for that day). You should have a pill box for ONLY levothyroxine on your bedside table to help you remember to take your medications.   - Continue Metformin 500 mg Twice a day with meals - Continue Trulicity at 1.06 mg weekly     HOW TO TREAT LOW BLOOD SUGARS (Blood sugar LESS THAN 70 MG/DL)  Please follow the RULE OF 15 for the treatment of hypoglycemia treatment (when your (blood sugars are less than 70 mg/dL)    STEP 1: Take 15 grams of carbohydrates when your blood sugar is low, which includes:   3-4 GLUCOSE TABS  OR  3-4 OZ OF JUICE OR REGULAR SODA OR  ONE TUBE OF GLUCOSE GEL     STEP 2: RECHECK blood sugar in 15 MINUTES STEP 3: If your blood sugar is still low at the 15 minute recheck --> then, go back to STEP 1 and treat AGAIN with another 15 grams of carbohydrates.

## 2019-04-13 NOTE — Progress Notes (Signed)
Name: Kerry Davis  MRN/ DOB: [id number], December 11, 1965    Age/ Sex: 53 y.o., female     PCP: McLean-Scocuzza, Nino Glow, MD   Reason for Endocrinology Evaluation: Hx of papillary thyroid cancer      Initial Endocrinology Clinic Visit: 10/08/2018    PATIENT IDENTIFIER: Kerry Davis is a 53 y.o., female with a past medical history of spondylolesthesis, asthma, depression, T2DM, GAD, HTN, papillary thyroid cancer She has followed with [facility] since 10/08/2018 for consultative assistance with management of her T2DM and papillary thyroid carcinoma.   THYROID HISTORICAL SUMMARY: She was diagnosed with follicular and papillary variant in 2011 12/2009 right lobectomy, tumor 1.6 cm, infiltrative and involved margins, one L.N negative) . 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, follicular subtype) 05/2535 99 mCi RAI  04/2010 post therapy whole body scan with uptake in the neck  06/2012 U/S revealed an enlarged level 6 L.N 06/2012 surgical resection of recurrent metastatic papillary cancer 3/3 (largest 2.2 cm) 08/2012 149 mCi RAI  09/2012 post-therapy WBS negative for abnormal uptake  Tg remained low through out  10/2013 Thyrogen stimulated WBS was negative and Tg 0.2, PET showed + neck L.N  11/2013 ultrasound stable 05/2014 U/S stable with sub-centimeter nodes 10/2014 I-131 WBS negative, PET scan stable "Grossly unchanged appearance of multiple bilateral 3-6 mm mildly FDG avid cervical lymph nodes 05/2015 : U/S no evidence of residual thyroid tissue , unremarkable L.N . No lesion requiring biopsy 08/2016 Thyrogen stimulated TG was 1.5 (negative) per records.   DIABETES HISTORICAL HISTORY : She was diagnosed with T2DM in 2011.  On her initial presentation to our clinic she was on metformin and Trulicity, she was on insulin in the past but has been off due to lifestyle changes.Hemoglobin A1c has ranged from 6.9% in 2019, peaking at  15.0% in 2011  SUBJECTIVE:   During last visit (01/11/2019): Her TSH was within goal, thyroglobulin antibody was undetectable, TG level was 1.1 ng/dL.  Her A1c was 6.9% we continued metformin and Trulicity.  We will continue levothyroxine at 150 MCG daily.  Today (04/13/2019):  Kerry Davis is here for 3 month folllow up on diabetes and Hx of papillary thyroid cancer.   She checks sugars 2-3 times a week. She has not been compliant with Metformin intake because she "feels" shaky at times and attributes this to metformin intake.  She does not confirm this with a finger stick check.She did not bring her meter today.  Denies any local neck symptoms. She does note scratching her throat in the past few weeks. She has not used any allergy meds.  Has chronic constipation  and chronic heat intoerance, denies depression and anxiety. Denies palpitations, or chest pain.   Continues to work 3rd shift.     ROS:  As per HPI.  HOME REGIMEN: Levothyroxine 150 mcg daily  Metformin 644 mg BID Trulicity 0.34 mg weekly   Statin: Yes ACE-I/ARB: yes Prior Diabetic Education: No     DIABETIC COMPLICATIONS: Microvascular complications:    Denies: retinopathy, nephropathy, neuropathy  Last eye exam: Completed 2019  Macrovascular complications:    Denies: CAD, PVD, CVA     HISTORY:  Past Medical History:  Past Medical History:  Diagnosis Date  . Anxiety   . Asthma   . Cancer ([place])    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 saliva production   . Colon polyps   . Depression   . Diabetes mellitus without  complication ([place])   . GERD (gastroesophageal reflux disease)   . Herpes    1+2  . Hyperlipidemia   . Hypertension   . OSA on CPAP   . Thyroid disease    Past Surgical History:  Past Surgical History:  Procedure Laterality Date  . COLONOSCOPY WITH PROPOFOL N/A 09/15/2018   Procedure: COLONOSCOPY WITH PROPOFOL;  Surgeon: [name], MD;  Location: [facility]  ENDOSCOPY;  Service: Endoscopy;  Laterality: N/A;  . DILATION AND CURETTAGE OF UTERUS    . LEEP    . SALIVARY GLAND SURGERY     left   . THYROIDECTOMY    . TUBAL LIGATION      Social History:  reports that she has been smoking. She has never used smokeless tobacco. She reports current alcohol use. She reports previous drug use. Family History:  Family History  Problem Relation Age of Onset  . Cancer Mother        breast  . Diabetes Mother   . Heart disease Mother        CHF  . Hypertension Mother   . Hyperparathyroidism Mother   . Diabetes Father   . Heart disease Father        CHF  . Hypertension Father   . Thyroid nodules Daughter   . Hypertension Son        ?  . Stroke Maternal Grandmother   . Cancer Maternal Grandmother        colon   . Cancer Paternal Grandfather        thyroid  . Diabetes Daughter        2     HOME MEDICATIONS: Allergies as of 04/13/2019   No Known Allergies     Medication List       Accurate as of Apr 13, 2019  9:51 AM. If you have any questions, ask your nurse or doctor.        albuterol 108 (90 Base) MCG/ACT inhaler Commonly known as:  VENTOLIN HFA Inhale into the lungs.   ALPRAZolam 1 MG tablet Commonly known as:  XANAX Take 1 tablet (1 mg total) by mouth daily as needed for anxiety.   atorvastatin 10 MG tablet Commonly known as:  LIPITOR Take 1 tablet (10 mg total) by mouth daily at 6 PM.   Cholecalciferol 1.25 MG (50000 UT) capsule Take 1 capsule (50,000 Units total) by mouth once a week.   cyclobenzaprine 5 MG tablet Commonly known as:  FLEXERIL Take 1 tablet (5 mg total) by mouth at bedtime as needed for muscle spasms.   Dulaglutide 0.75 MG/0.5ML Sopn Inject 0.75 mg into the skin once a week.   enalapril 10 MG tablet Commonly known as:  VASOTEC Take 1 tablet (10 mg total) by mouth daily.   freestyle lancets   FREESTYLE LITE test strip Generic drug:  glucose blood   GlucoCom Blood Glucose Monitor Devi Ok to  fill per pt's insurance formulary   hydrochlorothiazide 25 MG tablet Commonly known as:  HYDRODIURIL Take 1 tablet (25 mg total) by mouth daily.   levothyroxine 150 MCG tablet Commonly known as:  SYNTHROID   metFORMIN 500 MG tablet Commonly known as:  GLUCOPHAGE Take 1 tablet (500 mg total) by mouth 2 (two) times daily with a meal.   omeprazole 40 MG capsule Commonly known as:  PRILOSEC Take 1 capsule (40 mg total) by mouth daily.   traZODone 100 MG tablet Commonly known as:  DESYREL Take 1 tablet (100 mg total) by  mouth at bedtime.   venlafaxine XR 75 MG 24 hr capsule Commonly known as:  EFFEXOR-XR Take 1 capsule (75 mg total) by mouth daily with breakfast.         OBJECTIVE:   PHYSICAL EXAM: VS: BP (!) 138/102   Pulse 91   Temp 98.2 F (36.8 C)   Ht 5\' 9"  (1.753 m)   Wt 242 lb (109.8 kg)   SpO2 98%   BMI 35.74 kg/m    EXAM: General: Pt appears well and is in NAD  Neck: General: Supple without adenopathy. Thyroid: Surgically absent.  No nodules appreciated.  Lungs: Clear with good BS bilat with no rales, rhonchi, or wheezes  Heart: Auscultation: RRR.  Abdomen: Normoactive bowel sounds, soft, nontender, without masses or organomegaly palpable  Extremities:  BL LE: No pretibial edema normal ROM and strength.  Mental Status: Judgment, insight: Intact Orientation: Oriented to time, place, and person Mood and affect: No depression, anxiety, or agitation     DATA REVIEWED:   Results for Kerry Davis, Kerry Davis (MRN [id number]) as of 04/14/2019 14:35  Ref. Range 04/13/2019 09:13 04/13/2019 09:39  Hemoglobin A1C Latest Ref Range: 4.0 - 5.6 % 7.5 (A)   TSH Latest Ref Range: 0.35 - 4.50 uIU/mL  0.11 (L)  T4,Free(Direct) Latest Ref Range: 0.60 - 1.60 ng/dL  1.29  Thyroglobulin Latest Units: ng/mL  0.8 (L)  Thyroglobulin Ab Latest Ref Range: < or = 1 IU/mL  <1    ASSESSMENT / PLAN / RECOMMENDATIONS:   1. Hx of Papillary Thyroid Cancer: S/P thyroidectomy and RAI  ablation x 2 (2011 &2013):  - Pt had metastatic recurrence to L.N in 2013. Post-treatment scan has been negative - Pt is clinically euthyroid - As per her previous endocrinologist she will need another Thyrogen stimulated tumor marker in 2020 with WBS, given uptrend of the Tg will proceed with this. - Will also order thyroid bed ultrasound  - So  far she has had excellent structural and biochemical response - TSH is below goa, will reduce LT-4 replacement as below   Medications Start Levothyroxine 125 mcg daily   2. Post-operative Hypothyroidism : - Clinically euthyroid - TSH goal 0.5-2.0  - Pt educated extensively on the correct way to take levothyroxine (first thing in the morning with water, 30 minutes before eating or taking other medications). - Pt encouraged to double dose the following day if she were to miss a dose given long half-life of levothyroxine.  Medications Stop Levothyroxine 150 mcg daily  Start Levothyroxine 125 mcg daily    3. Type 2 Diabetes Mellitus,Sub-Optimally controled, without complications: D4Y 8.1%   GENERAL:  Poorly controlled diabetes due to medication non-adherence.  She denies any side effects from the meds.   Discussed importance of euglycemia in reducing microvascular complications  She was advised to check glucose when she has symptoms of hypoglycemia, to confirm readings.   I will not be making any adjustments as she has not been taking metformin consistently     MEDICATIONS:  Continue Metformin 500 mg BID  Continue Trulicity 4.48 mg weekly   EDUCATION / INSTRUCTIONS:  BG monitoring instructions: Patient is instructed to check her blood sugars 2 times a day, fasting and bedtime  Call [facility] if: BG persistently < 70 or > 300.   4) Diabetic complications:   Eye: Does not have known diabetic retinopathy.  Neuro/ Feet: Does not have known diabetic peripheral neuropathy.  Renal: Patient does not  have known baseline CKD. She  is on an ACEI/ARB at present.   5) Lipids: Patient is on a statin. LDL has been  above goal at 125 mg/dL , this is due to non-compliance, we again discussed risk of CAD , PVD and CVA.   6) Hypertension: She is not at goal of < 140/90 mmHg. Will defer to PCP. Pt states her readings are much lower at home. I have asked her to compare her readings somewhere else to her home machine, to make sure its accurate.    F/U in 3 months   Signed electronically by: Mack Guise, MD  [facility] End[facility]in[facility]  [facility] [address] [place], [place] [address] Phone: [phone] FAX: [phone]      CC: McLean-Scocuzza, Nino Glow, MD [place] [address] Phone: [phone]  Fax: [phone]   Return to Endocrinology clinic as below: Future Appointments  Date Time Provider [place]  07/14/2019  9:10 AM Dilana Mcphie, Melanie Crazier, MD [facility] None  07/27/2019  8:00 AM McLean-Scocuzza, Nino Glow, MD [facility]

## 2019-04-14 ENCOUNTER — Encounter: Payer: Self-pay | Admitting: Internal Medicine

## 2019-04-14 LAB — THYROGLOBULIN ANTIBODY: Thyroglobulin Ab: <1 IU/mL (ref <=1)

## 2019-04-14 LAB — THYROGLOBULIN LEVEL: Thyroglobulin: 0.8 ng/mL — ABNORMAL LOW

## 2019-04-14 MED ORDER — LEVOTHYROXINE SODIUM 125 MCG PO TABS: 125.0000 ug | ORAL_TABLET | Freq: Every day | ORAL | 2 refills | Status: DC

## 2019-05-03 ENCOUNTER — Other Ambulatory Visit: Payer: Self-pay

## 2019-05-03 ENCOUNTER — Encounter (HOSPITAL_COMMUNITY)
Admission: RE | Admit: 2019-05-03 | Discharge: 2019-05-03 | Disposition: A | Discharge status: Home / Self Care | Payer: No Typology Code available for payment source | Source: Ambulatory Visit | Attending: Internal Medicine | Admitting: Internal Medicine

## 2019-05-03 DIAGNOSIS — C73 Malignant neoplasm of thyroid gland: Secondary | ICD-10-CM | POA: Insufficient documentation

## 2019-05-03 MED ORDER — THYROTROPIN ALFA 1.1 MG IM SOLR
0.9000 mg | INTRAMUSCULAR | Status: AC
  Administered 2019-05-03: 0.9 mg via INTRAMUSCULAR

## 2019-05-03 MED ORDER — THYROTROPIN ALFA 1.1 MG IM SOLR
INTRAMUSCULAR | Status: AC
  Filled 2019-05-03: qty 0.9

## 2019-05-04 ENCOUNTER — Encounter (HOSPITAL_COMMUNITY)
Admission: RE | Admit: 2019-05-04 | Discharge: 2019-05-04 | Disposition: A | Discharge status: Home / Self Care | Payer: No Typology Code available for payment source | Source: Ambulatory Visit | Attending: Internal Medicine | Admitting: Internal Medicine

## 2019-05-04 DIAGNOSIS — C73 Malignant neoplasm of thyroid gland: Secondary | ICD-10-CM | POA: Diagnosis not present

## 2019-05-04 MED ORDER — THYROTROPIN ALFA 1.1 MG IM SOLR
INTRAMUSCULAR | Status: AC
  Filled 2019-05-04: qty 0.9

## 2019-05-04 MED ORDER — THYROTROPIN ALFA 1.1 MG IM SOLR
0.9000 mg | INTRAMUSCULAR | Status: AC
  Administered 2019-05-04: 0.9 mg via INTRAMUSCULAR

## 2019-05-05 ENCOUNTER — Other Ambulatory Visit: Payer: Self-pay

## 2019-05-05 ENCOUNTER — Encounter (HOSPITAL_COMMUNITY)
Admission: RE | Admit: 2019-05-05 | Discharge: 2019-05-05 | Disposition: A | Discharge status: Home / Self Care | Payer: No Typology Code available for payment source | Source: Ambulatory Visit | Attending: Internal Medicine | Admitting: Internal Medicine

## 2019-05-05 DIAGNOSIS — C73 Malignant neoplasm of thyroid gland: Secondary | ICD-10-CM | POA: Diagnosis not present

## 2019-05-05 MED ORDER — SODIUM IODIDE I 131 CAPSULE
4.3600 | Freq: Once | INTRAVENOUS | Status: AC | PRN
  Administered 2019-05-05: 4.36 via ORAL

## 2019-05-07 ENCOUNTER — Encounter (HOSPITAL_COMMUNITY)
Admission: RE | Admit: 2019-05-07 | Discharge: 2019-05-07 | Disposition: A | Discharge status: Home / Self Care | Payer: No Typology Code available for payment source | Source: Ambulatory Visit | Attending: Internal Medicine | Admitting: Internal Medicine

## 2019-05-07 ENCOUNTER — Other Ambulatory Visit: Payer: Self-pay

## 2019-05-07 ENCOUNTER — Other Ambulatory Visit: Payer: Self-pay | Admitting: Internal Medicine

## 2019-05-07 ENCOUNTER — Other Ambulatory Visit
Admission: RE | Admit: 2019-05-07 | Discharge: 2019-05-07 | Disposition: A | Discharge status: Home / Self Care | Payer: No Typology Code available for payment source | Source: Ambulatory Visit | Attending: Internal Medicine | Admitting: Internal Medicine

## 2019-05-07 DIAGNOSIS — C73 Malignant neoplasm of thyroid gland: Secondary | ICD-10-CM | POA: Insufficient documentation

## 2019-05-07 LAB — T4, FREE: Free T4: 0.81 ng/dL (ref 0.61–1.12)

## 2019-05-07 LAB — TSH: TSH: 13 u[IU]/mL — ABNORMAL HIGH (ref 0.350–4.500)

## 2019-05-07 MED ORDER — SODIUM IODIDE I 131 CAPSULE: 4.8600 | Freq: Once | INTRAVENOUS | Status: DC | PRN

## 2019-05-07 NOTE — Addendum Note (Signed)
Addended by: Santiago Bur on: 05/07/2019 01:54 PM   Modules accepted: Orders

## 2019-05-12 ENCOUNTER — Ambulatory Visit
Admission: RE | Admit: 2019-05-12 | Discharge: 2019-05-12 | Disposition: A | Discharge status: Home / Self Care | Payer: No Typology Code available for payment source | Source: Ambulatory Visit | Attending: Internal Medicine | Admitting: Internal Medicine

## 2019-05-12 DIAGNOSIS — C73 Malignant neoplasm of thyroid gland: Secondary | ICD-10-CM

## 2019-05-12 LAB — THYROGLOBULIN ANTIBODY: Thyroglobulin Antibody: <1 IU/mL (ref 0.0–0.9)

## 2019-06-09 ENCOUNTER — Encounter: Payer: Self-pay | Admitting: Internal Medicine

## 2019-06-11 ENCOUNTER — Encounter: Payer: Self-pay | Admitting: Internal Medicine

## 2019-06-11 ENCOUNTER — Other Ambulatory Visit: Payer: Self-pay

## 2019-06-11 MED ORDER — TRULICITY 0.75 MG/0.5ML ~~LOC~~ SOAJ: 0.7500 mg | SUBCUTANEOUS | 3 refills | Status: DC

## 2019-07-12 ENCOUNTER — Other Ambulatory Visit: Payer: Self-pay

## 2019-07-14 ENCOUNTER — Ambulatory Visit: Payer: No Typology Code available for payment source | Admitting: Internal Medicine

## 2019-07-14 ENCOUNTER — Other Ambulatory Visit: Payer: Self-pay

## 2019-07-14 ENCOUNTER — Encounter: Payer: Self-pay | Admitting: Internal Medicine

## 2019-07-14 VITALS — BP 136/88 | HR 78 | Temp 98.4°F | Ht 69.0 in | Wt 246.6 lb

## 2019-07-14 DIAGNOSIS — E119 Type 2 diabetes mellitus without complications: Secondary | ICD-10-CM

## 2019-07-14 DIAGNOSIS — C73 Malignant neoplasm of thyroid gland: Secondary | ICD-10-CM | POA: Diagnosis not present

## 2019-07-14 DIAGNOSIS — E890 Postprocedural hypothyroidism: Secondary | ICD-10-CM | POA: Diagnosis not present

## 2019-07-14 LAB — T4, FREE: Free T4: 0.92 ng/dL (ref 0.60–1.60)

## 2019-07-14 LAB — BASIC METABOLIC PANEL
BUN: 15 mg/dL (ref 6–23)
CO2: 29 mEq/L (ref 19–32)
Calcium: 9.6 mg/dL (ref 8.4–10.5)
Chloride: 102 mEq/L (ref 96–112)
Creatinine, Ser: 0.99 mg/dL (ref 0.40–1.20)
GFR: 71.18 mL/min (ref >60.00)
Glucose, Bld: 130 mg/dL — ABNORMAL HIGH (ref 70–99)
Potassium: 3.5 mEq/L (ref 3.5–5.1)
Sodium: 141 mEq/L (ref 135–145)

## 2019-07-14 LAB — POCT GLYCOSYLATED HEMOGLOBIN (HGB A1C): Hemoglobin A1C: 7.4 % — AB (ref 4.0–5.6)

## 2019-07-14 LAB — GLUCOSE, POCT (MANUAL RESULT ENTRY): POC Glucose: 136 mg/dl — AB (ref 70–99)

## 2019-07-14 LAB — TSH: TSH: 5.19 u[IU]/mL — ABNORMAL HIGH (ref 0.35–4.50)

## 2019-07-14 MED ORDER — TRULICITY 1.5 MG/0.5ML ~~LOC~~ SOAJ: 1.5000 mg | SUBCUTANEOUS | 11 refills | Status: DC

## 2019-07-14 NOTE — Progress Notes (Signed)
Name: Kerry Davis  MRN/ DOB: [id number], [birth year]    Age/ Sex: 53 y.o., female     PCP: McLean-Scocuzza, Nino Glow, MD   Reason for Endocrinology Evaluation: Hx of papillary thyroid cancer      Initial Endocrinology Clinic Visit: 10/08/2018    PATIENT IDENTIFIER: Kerry Davis is a 53 y.o., female with a past medical history of spondylolesthesis, asthma, depression, T2DM, GAD, HTN, papillary thyroid cancer She has followed with [facility] since 10/08/2018 for consultative assistance with management of her T2DM and papillary thyroid carcinoma.   THYROID HISTORICAL SUMMARY: She was diagnosed with follicular and papillary variant in 2011 12/2009 right lobectomy, tumor 1.6 cm, infiltrative and involved margins, one L.N negative) . 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, follicular subtype) 123456 99 mCi RAI  04/2010 post therapy whole body scan with uptake in the neck  06/2012 U/S revealed an enlarged level 6 L.N 06/2012 surgical resection of recurrent metastatic papillary cancer 3/3 (largest 2.2 cm) 08/2012 149 mCi RAI  09/2012 post-therapy WBS negative for abnormal uptake  Tg remained low through out  10/2013 Thyrogen stimulated WBS was negative and Tg 0.2, PET showed + neck L.N  11/2013 ultrasound stable 05/2014 U/S stable with sub-centimeter nodes 10/2014 I-131 WBS negative, PET scan stable "Grossly unchanged appearance of multiple bilateral 3-6 mm mildly FDG avid cervical lymph nodes 05/2015 : U/S no evidence of residual thyroid tissue , unremarkable L.N . No lesion requiring biopsy 08/2016 Thyrogen stimulated TG was 1.5 (negative) per records.  05/07/2019: Negative Thyrogen stimulated WBS. No evidence of residual disease on [place]. TSH 13000 uIU/mL, somehow the Tg was not drawn but Tg Ab was undetectable.   DIABETES HISTORICAL HISTORY : She was diagnosed with T2DM in 2011.  On her initial presentation to our clinic she was  on metformin and Trulicity, she was on insulin in the past but has been off due to lifestyle changes.Hemoglobin A1c has ranged from 6.9% in 2019, peaking at 15.0% in 2011  SUBJECTIVE:   During last visit (04/13/2019):  Her A1c was 7.5 % we continued metformin and Trulicity.  Reduced levothyroxine to 125 MCG daily.  Today (07/14/2019):  Kerry Davis is here for 3 month folllow up on diabetes and Hx of papillary thyroid cancer.   She checks sugars 2 times a week. She has not been compliant with Metformin intake because she "feels" shaky at times and attributes this to metformin intake.  She does not confirm this with a finger stick check.She did not bring her meter today.  Denies any local neck symptoms. She does note scratching her throat in the past few weeks. She has not used any allergy meds.  Has chronic constipation  and chronic heat intoerance, denies depression and anxiety. Denies palpitations, or chest pain.   Continues to work 3rd shift.     ROS:  As per HPI.  HOME REGIMEN: Levothyroxine 125 mcg daily  Metformin [id number] mg BID Trulicity [id number] mg weekly    Statin: Yes ACE-I/ARB: yes Prior Diabetic Education: No     DIABETIC COMPLICATIONS: Microvascular complications:    Denies: retinopathy, nephropathy, neuropathy  Last eye exam: Completed 2019  Macrovascular complications:    Denies: CAD, PVD, CVA     HISTORY:  Past Medical History:  Past Medical History:  Diagnosis Date  . Anxiety   . Asthma   . Cancer ([place])    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 saliva production   .  Colon polyps   . Depression   . Diabetes mellitus without complication ([place])   . GERD (gastroesophageal reflux disease)   . Herpes    1+2  . Hyperlipidemia   . Hypertension   . OSA on CPAP   . Thyroid disease    Past Surgical History:  Past Surgical History:  Procedure Laterality Date  . COLONOSCOPY WITH PROPOFOL N/A 09/15/2018   Procedure: COLONOSCOPY WITH  PROPOFOL;  Surgeon: [name], MD;  Location[facility] END[facility]COPY;  Service: Endoscopy;  Laterality: N/A;  . DILATION AND CURETTAGE OF UTERUS    . LEEP    . SALIVARY GLAND SURGERY     left   . THYROIDECTOMY    . TUBAL LIGATION      Social History:  reports that she has been smoking. She has never used smokeless tobacco. She reports current alcohol use. She reports previous drug use. Family History:  Family History  Problem Relation Age of Onset  . Cancer Mother        breast  . Diabetes Mother   . Heart disease Mother        CHF  . Hypertension Mother   . Hyperparathyroidism Mother   . Diabetes Father   . Heart disease Father        CHF  . Hypertension Father   . Thyroid nodules Daughter   . Hypertension Son        ?  . Stroke Maternal Grandmother   . Cancer Maternal Grandmother        colon   . Cancer Paternal Grandfather        thyroid  . Diabetes Daughter        2     HOME MEDICATIONS: Allergies as of 07/14/2019   No Known Allergies     Medication List       Accurate as of July 14, 2019  9:48 AM. If you have any questions, ask your nurse or doctor.        STOP taking these medications   Trulicity [id number] 0000000 Sopn Generic drug: Dulaglutide Replaced by: Trulicity 1.5 0000000 Sopn Stopped by: Dorita Sciara, MD     TAKE these medications   albuterol 108 (90 Base) MCG/ACT inhaler Commonly known as: VENTOLIN HFA Inhale into the lungs.   ALPRAZolam 1 MG tablet Commonly known as: XANAX Take 1 tablet (1 mg total) by mouth daily as needed for anxiety.   atorvastatin 10 MG tablet Commonly known as: LIPITOR Take 1 tablet (10 mg total) by mouth daily at 6 PM.   Cholecalciferol 1.25 MG (50000 UT) capsule Take 1 capsule (50,000 Units total) by mouth once a week.   cyclobenzaprine 5 MG tablet Commonly known as: FLEXERIL Take 1 tablet (5 mg total) by mouth at bedtime as needed for muscle spasms.   enalapril 10 MG tablet Commonly known as:  VASOTEC Take 1 tablet (10 mg total) by mouth daily.   freestyle lancets   FREESTYLE LITE test strip Generic drug: glucose blood   GlucoCom Blood Glucose Monitor Devi Ok to fill per pt's insurance formulary   hydrochlorothiazide 25 MG tablet Commonly known as: HYDRODIURIL Take 1 tablet (25 mg total) by mouth daily.   levothyroxine 125 MCG tablet Commonly known as: SYNTHROID Take 1 tablet (125 mcg total) by mouth daily.   metFORMIN 500 MG tablet Commonly known as: GLUCOPHAGE Take 1 tablet (500 mg total) by mouth 2 (two) times daily with a meal.   omeprazole 40 MG capsule Commonly  known as: PRILOSEC Take 1 capsule (40 mg total) by mouth daily.   traZODone 100 MG tablet Commonly known as: DESYREL Take 1 tablet (100 mg total) by mouth at bedtime.   Trulicity 1.5 0000000 Sopn Generic drug: Dulaglutide Inject 1.5 mg into the skin once a week. Replaces: Trulicity A999333 0000000 Sopn Started by: Dorita Sciara, MD   venlafaxine XR 75 MG 24 hr capsule Commonly known as: EFFEXOR-XR Take 1 capsule (75 mg total) by mouth daily with breakfast.         OBJECTIVE:   PHYSICAL EXAM: VS: BP 136/88 (BP Location: Left Arm, Patient Position: Sitting, Cuff Size: Normal)   Pulse 78   Temp 98.4 F (36.9 C)   Ht 5\' 9"  (1.753 m)   Wt 246 lb 9.6 oz (111.9 kg)   SpO2 99%   BMI 36.42 kg/m    EXAM: General: Pt appears well and is in NAD  Neck: General: Supple without adenopathy. Thyroid: Surgically absent.  No nodules appreciated.  Lungs: Clear with good BS bilat with no rales, rhonchi, or wheezes  Heart: Auscultation: RRR.  Abdomen: Normoactive bowel sounds, soft, nontender, without masses or organomegaly palpable  Extremities:  BL LE: No pretibial edema normal ROM and strength.  Mental Status: Judgment, insight: Intact Orientation: Oriented to time, place, and person Mood and affect: No depression, anxiety, or agitation    DM Foot 07/14/19 The skin of the feet is  intact without sores or ulcerations. The pedal pulses are 2+ on right and 2+ on left. The sensation is intact to a screening 5.07, 10 gram monofilament bilaterally    DATA REVIEWED:   In-Office BG 136 mg/dL    ASSESSMENT / PLAN / RECOMMENDATIONS:   1. Hx of Papillary Thyroid Cancer: S/P thyroidectomy and RAI ablation x 2 (2011 &2013):  - Pt had metastatic recurrence to L.N in 2013. Post-treatment scan has been negative - Pt is clinically euthyroid - Thyrogen stimulated WBS and Neck ultrasound were negative as of 04/2019 - So  far she has had excellent structural and biochemical response - Tg and Tg Ab's - pending  - TSH is elevated on 125 mcg daily, was too low on 150 mcg daily, will adjust as below   Medications  Levothyroxine 125 mcg daily except Sunday take 2 tablets   2. Post-operative Hypothyroidism : - Clinically euthyroid - TSH goal 0.5-2.0  - Pt educated extensively on the correct way to take levothyroxine (first thing in the morning with water, 30 minutes before eating or taking other medications). - Pt encouraged to double dose the following day if she were to miss a dose given long half-life of levothyroxine.  Medications  Levothyroxine 125 mcg daily except Sunday take 2 tablets    3. Type 2 Diabetes Mellitus,Sub-Optimally controled, without complications: [id number]   GENERAL:  Continues to have suboptimally controlled diabetes, will adjust medications as below  I have encouraged her to continue with medication compliance and glucose checks.   I have praised her on avoiding sugar-sweetened beverages.     MEDICATIONS:  Continue Metformin 500 mg BID  Increase Trulicity 1.5  mg weekly   EDUCATION / INSTRUCTIONS:  BG monitoring instructions: Patient is instructed to check her blood sugars 2 times a day, fasting and bedtime  Call [place] [facility] if: BG persistently < 70 or > 300.   4) Diabetic complications:   Eye: Does not have  known diabetic retinopathy.  Neuro/ Feet: Does not have known diabetic peripheral neuropathy.  Renal: Patient does not have known baseline CKD. She is on an ACEI/ARB at present.    F/U in 3 months   Signed electronically by: Mack Guise, MD  [facility] Endocrinology  [facility] [address] [place], [place] [address] Phone: [phone] FAX: [phone]      CC: McLean-Scocuzza, Nino Glow, MD [place] [address] Phone: [phone]  Fax: [phone]   Return to Endocrinology clinic as below: Future Appointments  Date Time Provider [place]  09/03/2019  1:30 PM McLean-Scocuzza, Nino Glow, MD [facility]

## 2019-07-14 NOTE — Patient Instructions (Signed)
-   Continue Metformin 500 mg , TWO tablets daily  - Increase Trulicity to 1.5 mg  Weekly      HOW TO TREAT LOW BLOOD SUGARS (Blood sugar LESS THAN 70 MG/DL)  Please follow the RULE OF 15 for the treatment of hypoglycemia treatment (when your (blood sugars are less than 70 mg/dL)    STEP 1: Take 15 grams of carbohydrates when your blood sugar is low, which includes:   3-4 GLUCOSE TABS  OR  3-4 OZ OF JUICE OR REGULAR SODA OR  ONE TUBE OF GLUCOSE GEL     STEP 2: RECHECK blood sugar in 15 MINUTES STEP 3: If your blood sugar is still low at the 15 minute recheck --> then, go back to STEP 1 and treat AGAIN with another 15 grams of carbohydrates.

## 2019-07-15 ENCOUNTER — Encounter: Payer: Self-pay | Admitting: Internal Medicine

## 2019-07-15 LAB — THYROGLOBULIN LEVEL: Thyroglobulin: 3.3 ng/mL

## 2019-07-15 LAB — THYROGLOBULIN ANTIBODY: Thyroglobulin Ab: <1 IU/mL (ref <=1)

## 2019-07-15 MED ORDER — LEVOTHYROXINE SODIUM 125 MCG PO TABS: 125.0000 ug | ORAL_TABLET | ORAL | 2 refills | Status: DC

## 2019-07-16 ENCOUNTER — Other Ambulatory Visit: Payer: Self-pay | Admitting: Internal Medicine

## 2019-07-16 DIAGNOSIS — C73 Malignant neoplasm of thyroid gland: Secondary | ICD-10-CM

## 2019-07-16 DIAGNOSIS — E119 Type 2 diabetes mellitus without complications: Secondary | ICD-10-CM

## 2019-07-27 ENCOUNTER — Ambulatory Visit: Payer: Self-pay | Admitting: Internal Medicine

## 2019-09-03 ENCOUNTER — Other Ambulatory Visit: Payer: Self-pay

## 2019-09-03 ENCOUNTER — Ambulatory Visit (INDEPENDENT_AMBULATORY_CARE_PROVIDER_SITE_OTHER): Payer: No Typology Code available for payment source | Admitting: Internal Medicine

## 2019-09-03 VITALS — Ht 69.0 in | Wt 245.0 lb

## 2019-09-03 DIAGNOSIS — D649 Anemia, unspecified: Secondary | ICD-10-CM

## 2019-09-03 DIAGNOSIS — E785 Hyperlipidemia, unspecified: Secondary | ICD-10-CM

## 2019-09-03 DIAGNOSIS — N939 Abnormal uterine and vaginal bleeding, unspecified: Secondary | ICD-10-CM

## 2019-09-03 DIAGNOSIS — F329 Major depressive disorder, single episode, unspecified: Secondary | ICD-10-CM

## 2019-09-03 DIAGNOSIS — E559 Vitamin D deficiency, unspecified: Secondary | ICD-10-CM

## 2019-09-03 DIAGNOSIS — I10 Essential (primary) hypertension: Secondary | ICD-10-CM

## 2019-09-03 DIAGNOSIS — E119 Type 2 diabetes mellitus without complications: Secondary | ICD-10-CM | POA: Diagnosis not present

## 2019-09-03 DIAGNOSIS — Z85850 Personal history of malignant neoplasm of thyroid: Secondary | ICD-10-CM

## 2019-09-03 DIAGNOSIS — C73 Malignant neoplasm of thyroid gland: Secondary | ICD-10-CM

## 2019-09-03 DIAGNOSIS — Z1231 Encounter for screening mammogram for malignant neoplasm of breast: Secondary | ICD-10-CM | POA: Diagnosis not present

## 2019-09-03 DIAGNOSIS — E890 Postprocedural hypothyroidism: Secondary | ICD-10-CM

## 2019-09-03 DIAGNOSIS — F419 Anxiety disorder, unspecified: Secondary | ICD-10-CM

## 2019-09-03 MED ORDER — ALPRAZOLAM 1 MG PO TABS: 1.0000 mg | ORAL_TABLET | Freq: Every day | ORAL | 5 refills | Status: DC | PRN

## 2019-09-03 NOTE — Progress Notes (Signed)
Telephone Note  I connected with Kerry Davis  on 09/03/19 at  1:30 PM EDT by a telephone and verified that I am speaking with the correct person using two identifiers.  Location patient: home Location provider:work or home office Persons participating in the virtual visit: patient, provider  I discussed the limitations of evaluation and management by telemedicine and the availability of in person appointments. The patient expressed understanding and agreed to proceed.   HPI: 1. Vaginal bleeding after menopause new w/in the last 1 month and abdominal cramping  2. Increased anxiety due to working, school 1 of Davis lives with Kerry Davis with 3 kids and other Davis lives with Kerry Davis in Kerry Davis 21s and using crack and selling Kerry Davis body  3. H/o thyroid cancer TSH elevated >5 on levo 125 mon-sat and Sunday 250 will repeat TSH PET scan negative 05/07/19 and [place] no recurrence 05/12/2019  4. DM2 X0N 7.4 on trulicity 1.5 mg 1x per week and metformin 500 mg bid   ROS: See pertinent positives and negatives per HPI.  Past Medical History:  Diagnosis Date  . Anxiety   . Asthma   . Cancer ([place])    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 saliva production   . Colon polyps   . Depression   . Diabetes mellitus without complication ([place])   . GERD (gastroesophageal reflux disease)   . Herpes    1+2  . Hyperlipidemia   . Hypertension   . OSA on CPAP   . Thyroid disease     Past Surgical History:  Procedure Laterality Date  . COLONOSCOPY WITH PROPOFOL N/A 09/15/2018   Procedure: COLONOSCOPY WITH PROPOFOL;  Surgeon: [name], MD;  Location: [facility] ENDOSCOPY;  Service: Endoscopy;  Laterality: N/A;  . DILATION AND CURETTAGE OF UTERUS    . LEEP    . SALIVARY GLAND SURGERY     left   . THYROIDECTOMY    . TUBAL LIGATION      Family History  Problem Relation Age of Onset  . Cancer Davis        breast  . Diabetes Davis   . Heart disease Davis        CHF  . Hypertension Davis   .  Hyperparathyroidism Davis   . Diabetes Davis   . Heart disease Davis        CHF  . Hypertension Davis   . Thyroid nodules Davis   . Hypertension Son        ?  . Stroke Maternal Grandmother   . Cancer Maternal Grandmother        colon   . Cancer Paternal Grandfather        thyroid  . Diabetes Davis        2    SOCIAL HX:  Lives with Davis and 3 grankids    Current Outpatient Medications:  .  albuterol (PROVENTIL HFA;VENTOLIN HFA) 108 (90 Base) MCG/ACT inhaler, Inhale into the lungs., Disp: , Rfl:  .  ALPRAZolam (XANAX) 1 MG tablet, Take 1 tablet (1 mg total) by mouth daily as needed for anxiety., Disp: 30 tablet, Rfl: 5 .  atorvastatin (LIPITOR) 10 MG tablet, Take 1 tablet (10 mg total) by mouth daily at 6 PM., Disp: 90 tablet, Rfl: 3 .  Blood Glucose Monitoring Suppl (GLUCOCOM BLOOD GLUCOSE MONITOR) DEVI, Ok to fill per [employer] formulary, Disp: , Rfl:  .  Cholecalciferol 50000 units capsule, Take 1 capsule (50,000 Units total) by mouth once  a week., Disp: 13 capsule, Rfl: 1 .  cyclobenzaprine (FLEXERIL) 5 MG tablet, Take 1 tablet (5 mg total) by mouth at bedtime as needed for muscle spasms., Disp: 30 tablet, Rfl: 11 .  Dulaglutide (TRULICITY) 1.5 DU/2.0UR SOPN, Inject 1.5 mg into the skin once a week., Disp: 4 pen, Rfl: 11 .  enalapril (VASOTEC) 10 MG tablet, Take 1 tablet (10 mg total) by mouth daily., Disp: 90 tablet, Rfl: 3 .  FREESTYLE LITE test strip, , Disp: , Rfl: 3 .  hydrochlorothiazide (HYDRODIURIL) 25 MG tablet, Take 1 tablet (25 mg total) by mouth daily., Disp: 90 tablet, Rfl: 3 .  Lancets (FREESTYLE) lancets, , Disp: , Rfl: 3 .  levothyroxine (SYNTHROID) 125 MCG tablet, Take 1 tablet (125 mcg total) by mouth as directed. 8 tablets weekly, Disp: 98 tablet, Rfl: 2 .  metFORMIN (GLUCOPHAGE) 500 MG tablet, Take 1 tablet (500 mg total) by mouth 2 (two) times daily with a meal., Disp: 180 tablet, Rfl: 3 .  omeprazole (PRILOSEC) 40 MG capsule, Take 1  capsule (40 mg total) by mouth daily., Disp: 90 capsule, Rfl: 3 .  traZODone (DESYREL) 100 MG tablet, Take 1 tablet (100 mg total) by mouth at bedtime., Disp: 90 tablet, Rfl: 3 .  venlafaxine XR (EFFEXOR-XR) 75 MG 24 hr capsule, Take 1 capsule (75 mg total) by mouth daily with breakfast., Disp: 90 capsule, Rfl: 3  EXAM:  VITALS per patient if applicable:  GENERAL: alert, oriented, appears well and in no acute distress  HEENT: atraumatic, conjunttiva clear, no obvious abnormalities on inspection of external nose and ears  NECK: normal movements of the head and neck  LUNGS: on inspection no signs of respiratory distress, breathing rate appears normal, no obvious gross SOB, gasping or wheezing  CV: no obvious cyanosis  MS: moves all visible extremities without noticeable abnormality  PSYCH/NEURO: pleasant and cooperative, no obvious depression or anxiety, speech and thought processing grossly intact  ASSESSMENT AND PLAN:  Discussed the following assessment and plan:  Type 2 diabetes mellitus without complication, without long-term current use of insulin (HCC) - Plan: Comprehensive metabolic panel, CBC with Differential/Platelet, Lipid panel, Urinalysis, Routine w reflex microscopic, Microalbumin / creatinine urine ratio, Hemoglobin A1c Cont meds   History of thyroid cancer no recurrence s/p ablation f/u endocrine  Check TSH  Anxiety and depression Refilled xanax   Essential hypertension - Plan: Comprehensive metabolic panel, CBC with Differential/Platelet Cont meds   Vitamin D deficiency - Plan: Vitamin D (25 hydroxy)  Anemia, unspecified type - Plan: Iron, TIBC and Ferritin Panel  Anxiety - Plan: ALPRAZolam (XANAX) 1 MG tablet  Abnormal vaginal bleeding - Plan: Ambulatory referral to Obstetrics / Gynecology Dr. Georgianne Fick further w/u for cramping and ab pain   HM Flu shothad 08/09/19 Tdap had 12/08/15  pna 23 utd  Hep A/B vx hadhep B immune Disc shingrix vaccinehold  vaccine pt wants to think about it  Per pt check MMR and immune  Due colonoscopy h/o polypsnoted 09/18/15 pt wants repeat due to [place] mGM colon cancer  -09/15/18 normal repeat in 5 years with FH Pap at f/u h/o abnormal pap and LEEP -08/25/18 negative negative HPV Mammogram2/15/19 negreferred pt to schedule  Smoker 1/2 ppd rec cessation rec cessation   -we discussed possible serious and likely etiologies, options for evaluation and workup, limitations of telemedicine visit vs in person visit, treatment, treatment risks and precautions. Pt prefers to treat via telemedicine empirically rather then risking or undertaking an in person visit at  this moment. Patient agrees to seek prompt in person care if worsening, new symptoms arise, or if is not improving with treatment.   I discussed the assessment and treatment plan with the patient. The patient was provided an opportunity to ask questions and all were answered. The patient agreed with the plan and demonstrated an understanding of the instructions.   The patient was advised to call back or seek an in-person evaluation if the symptoms worsen or if the condition fails to improve as anticipated.  Time spent 20 minutes   Delorise Jackson, MD

## 2019-09-10 ENCOUNTER — Other Ambulatory Visit: Payer: Self-pay

## 2019-09-10 ENCOUNTER — Other Ambulatory Visit (INDEPENDENT_AMBULATORY_CARE_PROVIDER_SITE_OTHER): Payer: No Typology Code available for payment source

## 2019-09-10 DIAGNOSIS — D649 Anemia, unspecified: Secondary | ICD-10-CM

## 2019-09-10 DIAGNOSIS — E890 Postprocedural hypothyroidism: Secondary | ICD-10-CM | POA: Diagnosis not present

## 2019-09-10 DIAGNOSIS — E119 Type 2 diabetes mellitus without complications: Secondary | ICD-10-CM

## 2019-09-10 DIAGNOSIS — I10 Essential (primary) hypertension: Secondary | ICD-10-CM | POA: Diagnosis not present

## 2019-09-10 DIAGNOSIS — E559 Vitamin D deficiency, unspecified: Secondary | ICD-10-CM | POA: Diagnosis not present

## 2019-09-10 DIAGNOSIS — C73 Malignant neoplasm of thyroid gland: Secondary | ICD-10-CM | POA: Diagnosis not present

## 2019-09-10 LAB — COMPREHENSIVE METABOLIC PANEL
ALT: 21 U/L (ref 0–35)
AST: 15 U/L (ref 0–37)
Albumin: 4.1 g/dL (ref 3.5–5.2)
Alkaline Phosphatase: 64 U/L (ref 39–117)
BUN: 17 mg/dL (ref 6–23)
CO2: 28 mEq/L (ref 19–32)
Calcium: 9.3 mg/dL (ref 8.4–10.5)
Chloride: 103 mEq/L (ref 96–112)
Creatinine, Ser: 1.17 mg/dL (ref 0.40–1.20)
GFR: 58.67 mL/min — ABNORMAL LOW (ref >60.00)
Glucose, Bld: 170 mg/dL — ABNORMAL HIGH (ref 70–99)
Potassium: 3.7 mEq/L (ref 3.5–5.1)
Sodium: 140 mEq/L (ref 135–145)
Total Bilirubin: 0.3 mg/dL (ref 0.2–1.2)
Total Protein: 6.4 g/dL (ref 6.0–8.3)

## 2019-09-10 LAB — LIPID PANEL
Cholesterol: 146 mg/dL (ref 0–200)
HDL: 40.1 mg/dL (ref >39.00)
NonHDL: 106.27
Total CHOL/HDL Ratio: 4
Triglycerides: 256 mg/dL — ABNORMAL HIGH (ref 0.0–149.0)
VLDL: 51.2 mg/dL — ABNORMAL HIGH (ref 0.0–40.0)

## 2019-09-10 LAB — CBC WITH DIFFERENTIAL/PLATELET
Basophils Absolute: 0 10*3/uL (ref 0.0–0.1)
Basophils Relative: 0.7 % (ref 0.0–3.0)
Eosinophils Absolute: 0.1 10*3/uL (ref 0.0–0.7)
Eosinophils Relative: 2.2 % (ref 0.0–5.0)
HCT: 36.4 % (ref 36.0–46.0)
Hemoglobin: 12.1 g/dL (ref 12.0–15.0)
Lymphocytes Relative: 36 % (ref 12.0–46.0)
Lymphs Abs: 2.1 10*3/uL (ref 0.7–4.0)
MCHC: 33.2 g/dL (ref 30.0–36.0)
MCV: 86.7 fl (ref 78.0–100.0)
Monocytes Absolute: 0.3 10*3/uL (ref 0.1–1.0)
Monocytes Relative: 4.4 % (ref 3.0–12.0)
Neutro Abs: 3.3 10*3/uL (ref 1.4–7.7)
Neutrophils Relative %: 56.7 % (ref 43.0–77.0)
Platelets: 319 10*3/uL (ref 150.0–400.0)
RBC: 4.2 Mil/uL (ref 3.87–5.11)
RDW: 14.7 % (ref 11.5–15.5)
WBC: 5.9 10*3/uL (ref 4.0–10.5)

## 2019-09-10 LAB — IRON,TIBC AND FERRITIN PANEL
%SAT: 19 % (calc) (ref 16–45)
Ferritin: 138 ng/mL (ref 16–232)
Iron: 62 ug/dL (ref 45–160)
TIBC: 324 mcg/dL (calc) (ref 250–450)

## 2019-09-10 LAB — VITAMIN D 25 HYDROXY (VIT D DEFICIENCY, FRACTURES): VITD: 48.72 ng/mL (ref 30.00–100.00)

## 2019-09-10 LAB — LDL CHOLESTEROL, DIRECT: Direct LDL: 74 mg/dL

## 2019-09-10 LAB — TSH: TSH: 8.73 u[IU]/mL — ABNORMAL HIGH (ref 0.35–4.50)

## 2019-09-10 LAB — HEMOGLOBIN A1C: Hgb A1c MFr Bld: 7.8 % — ABNORMAL HIGH (ref 4.6–6.5)

## 2019-09-11 LAB — URINALYSIS, ROUTINE W REFLEX MICROSCOPIC
Bilirubin Urine: NEGATIVE
Glucose, UA: NEGATIVE
Hgb urine dipstick: NEGATIVE
Ketones, ur: NEGATIVE
Leukocytes,Ua: NEGATIVE
Nitrite: NEGATIVE
Protein, ur: NEGATIVE
Specific Gravity, Urine: 1.025 (ref 1.001–1.03)
pH: <=5 (ref 5.0–8.0)

## 2019-09-11 LAB — MICROALBUMIN / CREATININE URINE RATIO
Creatinine, Urine: 222 mg/dL (ref 20–275)
Microalb Creat Ratio: 1 mcg/mg creat (ref <30)
Microalb, Ur: 0.3 mg/dL

## 2019-09-17 ENCOUNTER — Telehealth: Payer: Self-pay | Admitting: Obstetrics and Gynecology

## 2019-09-17 NOTE — Telephone Encounter (Signed)
[facility] referring to Dr. Georgianne Fick cramps and right ovary pain Vaginal after menopause. Canceled via automated reminder system (Automated Cancel). Called and left voicemail for patient to call back to be schedule

## 2019-09-20 ENCOUNTER — Encounter: Payer: Self-pay | Admitting: Obstetrics and Gynecology

## 2019-09-21 NOTE — Telephone Encounter (Signed)
Called and left voice mail for patient to call back to be schedule °

## 2019-09-23 NOTE — Telephone Encounter (Signed)
Contacting PCP. Unable to reach patient to schedule appointment

## 2019-09-23 NOTE — Telephone Encounter (Signed)
Called and left voice mail for patient to call back to be schedule °

## 2019-10-01 ENCOUNTER — Telehealth: Payer: Self-pay | Admitting: Internal Medicine

## 2019-10-01 NOTE — Telephone Encounter (Signed)
Pt can take D3 2000 to 4000 iu  Daily and stop D3 1x per week high dose

## 2019-10-01 NOTE — Telephone Encounter (Signed)
Unable to leave message for patient to return call back. [facility] may give results and obtain information.  

## 2019-10-11 ENCOUNTER — Other Ambulatory Visit: Payer: Self-pay

## 2019-10-13 ENCOUNTER — Other Ambulatory Visit: Payer: Self-pay

## 2019-10-13 ENCOUNTER — Encounter: Payer: Self-pay | Admitting: Internal Medicine

## 2019-10-13 ENCOUNTER — Ambulatory Visit: Payer: No Typology Code available for payment source | Admitting: Internal Medicine

## 2019-10-13 VITALS — BP 132/86 | HR 86 | Temp 98.0°F | Ht 69.0 in | Wt 247.0 lb

## 2019-10-13 DIAGNOSIS — E890 Postprocedural hypothyroidism: Secondary | ICD-10-CM | POA: Diagnosis not present

## 2019-10-13 DIAGNOSIS — E119 Type 2 diabetes mellitus without complications: Secondary | ICD-10-CM

## 2019-10-13 DIAGNOSIS — C73 Malignant neoplasm of thyroid gland: Secondary | ICD-10-CM | POA: Diagnosis not present

## 2019-10-13 LAB — GLUCOSE, POCT (MANUAL RESULT ENTRY): POC Glucose: 154 mg/dl — AB (ref 70–99)

## 2019-10-13 NOTE — Progress Notes (Signed)
Name: Kerry Davis  MRN/ DOB: [id number], [birth year]    Age/ Sex: 53 y.o., female     PCP: McLean-Scocuzza, Nino Glow, MD   Reason for Endocrinology Evaluation: Hx of papillary thyroid cancer      Initial Endocrinology Clinic Visit: 10/08/2018    PATIENT IDENTIFIER: [name] is a 53 y.o., female with a past medical history of spondylolesthesis, asthma, depression, T2DM, GAD, HTN, papillary thyroid cancer She has followed with [facility] since 10/08/2018 for consultative assistance with management of her T2DM and papillary thyroid carcinoma.   THYROID HISTORICAL SUMMARY: She was diagnosed with follicular and papillary variant in 2011 12/2009 right lobectomy, tumor 1.6 cm, infiltrative and involved margins, one L.N negative) . 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, follicular subtype) [id number] 99 mCi RAI  04/2010 post therapy whole body scan with uptake in the neck  06/2012 U/S revealed an enlarged level 6 L.N 06/2012 surgical resection of recurrent metastatic papillary cancer 3/3 (largest 2.2 cm) 08/2012 149 mCi RAI  09/2012 post-therapy WBS negative for abnormal uptake  Tg remained low through out  10/2013 Thyrogen stimulated WBS was negative and Tg 0.2, PET showed + neck L.N  11/2013 ultrasound stable 05/2014 U/S stable with sub-centimeter nodes 10/2014 I-131 WBS negative, PET scan stable "Grossly unchanged appearance of multiple bilateral 3-6 mm mildly FDG avid cervical lymph nodes 05/2015 : U/S no evidence of residual thyroid tissue , unremarkable L.N . No lesion requiring biopsy 08/2016 Thyrogen stimulated TG was 1.5 (negative) per records.  05/07/2019: Negative Thyrogen stimulated WBS. No evidence of residual disease on [place]. TSH 13000 uIU/mL, somehow the Tg was not drawn but Tg Ab was undetectable.   DIABETES HISTORICAL HISTORY : She was diagnosed with T2DM in 2011.  On her initial presentation to our clinic she was  on metformin and Trulicity, she was on insulin in the past but has been off due to lifestyle changes.Hemoglobin A1c has ranged from 6.9% in 2019, peaking at 15.0% in 2011  SUBJECTIVE:   During last visit (07/14/2019):  Her A1c was 7.4 % we continued metformin and increased  Trulicity.  Increased  levothyroxine to 2 tabs on Sundays of the  125 MCG dose.   Today (10/13/2019):  Ms. [name] is here for 3 month folllow up on diabetes and Hx of papillary thyroid cancer.   She checks sugars occasionally.  She admits to dietary indiscretions.  Denies any local neck symptoms.  Has chronic constipation  and chronic heat intoerance, as well as anxiety , her anxiety has been getting worse and had stopped venlafaxine.  Has occaisonal  palpitations, but no chest pain.   Continues to work 3rd shift.     ROS:  As per HPI.  HOME REGIMEN: Levothyroxine 125 mcg, 2 tabs on Sunday and 1 tab rest of the week  Metformin [id number] mg BID Trulicity 1.5 mg weekly        DIABETIC COMPLICATIONS: Microvascular complications:    Denies: retinopathy, nephropathy, neuropathy  Last eye exam: Completed 2019  Macrovascular complications:    Denies: CAD, PVD, CVA     HISTORY:  Past Medical History:  Past Medical History:  Diagnosis Date  . Anxiety   . Asthma   . 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 saliva production   . Colon polyps   . Depression   . Diabetes mellitus without complication ([place])   . GERD (gastroesophageal reflux disease)   .  Herpes    1+2  . Hyperlipidemia   . Hypertension   . OSA on CPAP   . Thyroid disease    Past Surgical History:  Past Surgical History:  Procedure Laterality Date  . COLONOSCOPY WITH PROPOFOL N/A 09/15/2018   Procedure: COLONOSCOPY WITH PROPOFOL;  Surgeon: [name], MD;  Location: [facility] END[facility]COPY;  Service: Endoscopy;  Laterality: N/A;  . DILATION AND CURETTAGE OF UTERUS    . LEEP    . SALIVARY GLAND SURGERY      left   . THYROIDECTOMY    . TUBAL LIGATION      Social History:  reports that she has been smoking. She has never used smokeless tobacco. She reports current alcohol use. She reports previous drug use. Family History:  Family History  Problem Relation Age of Onset  . Cancer Mother        breast  . Diabetes Mother   . Heart disease Mother        CHF  . Hypertension Mother   . Hyperparathyroidism Mother   . Diabetes Father   . Heart disease Father        CHF  . Hypertension Father   . Thyroid nodules Daughter   . Hypertension Son        ?  . Stroke Maternal Grandmother   . Cancer Maternal Grandmother        colon   . Cancer Paternal Grandfather        thyroid  . Diabetes Daughter        2     HOME MEDICATIONS: Allergies as of 10/13/2019   No Known Allergies     Medication List       Accurate as of October 13, 2019  1:22 PM. If you have any questions, ask your nurse or doctor.        albuterol 108 (90 Base) MCG/ACT inhaler Commonly known as: VENTOLIN HFA Inhale into the lungs.   ALPRAZolam 1 MG tablet Commonly known as: XANAX Take 1 tablet (1 mg total) by mouth daily as needed for anxiety.   atorvastatin 10 MG tablet Commonly known as: LIPITOR Take 1 tablet (10 mg total) by mouth daily at 6 PM.   Cholecalciferol 1.25 MG (50000 UT) capsule Take 1 capsule (50,000 Units total) by mouth once a week.   cyclobenzaprine 5 MG tablet Commonly known as: FLEXERIL Take 1 tablet (5 mg total) by mouth at bedtime as needed for muscle spasms.   enalapril 10 MG tablet Commonly known as: VASOTEC Take 1 tablet (10 mg total) by mouth daily.   freestyle lancets   FREESTYLE LITE test strip Generic drug: glucose blood   Gl[employer] Blood Glucose Monitor Devi Ok to fill per pt's insurance formulary   hydrochlorothiazide 25 MG tablet Commonly known as: HYDRODIURIL Take 1 tablet (25 mg total) by mouth daily.   levothyroxine 125 MCG tablet Commonly known as: SYNTHROID  Take 1 tablet (125 mcg total) by mouth as directed. 8 tablets weekly   metFORMIN 500 MG tablet Commonly known as: GLUCOPHAGE Take 1 tablet (500 mg total) by mouth 2 (two) times daily with a meal.   omeprazole 40 MG capsule Commonly known as: PRILOSEC Take 1 capsule (40 mg total) by mouth daily.   traZODone 100 MG tablet Commonly known as: DESYREL Take 1 tablet (100 mg total) by mouth at bedtime.   Trulicity 1.5 0000000 Sopn Generic drug: Dulaglutide Inject 1.5 mg into the skin once a week.   venlafaxine XR  75 MG 24 hr capsule Commonly known as: EFFEXOR-XR Take 1 capsule (75 mg total) by mouth daily with breakfast.         OBJECTIVE:   PHYSICAL EXAM: VS: BP 132/86 (BP Location: Left Arm, Patient Position: Sitting, Cuff Size: Normal)   Pulse 86   Temp 98 F (36.7 C)   Ht 5\' 9"  (1.753 m)   Wt 247 lb (112 kg)   SpO2 98%   BMI 36.48 kg/m    EXAM: General: Pt appears well and is in NAD  Neck: General: Supple without adenopathy. Thyroid: Surgically absent.  No nodules appreciated.  Lungs: Clear with good BS bilat with no rales, rhonchi, or wheezes  Heart: Auscultation: RRR.  Abdomen: Normoactive bowel sounds, soft, nontender, without masses or organomegaly palpable  Extremities:  BL LE: No pretibial edema normal ROM and strength.  Mental Status: Judgment, insight: Intact Orientation: Oriented to time, place, and person Mood and affect: No depression, anxiety, or agitation    DM Foot 07/14/19 The skin of the feet is intact without sores or ulcerations. The pedal pulses are 2+ on right and 2+ on left. The sensation is intact to a screening 5.07, 10 gram monofilament bilaterally    DATA REVIEWED:  Results for GREGG, FARFAN (MRN [id number]) as of 10/13/2019 13:22  Ref. Range 09/10/2019 09:25  Sodium Latest Ref Range: 135 - 145 mEq/L 140  Potassium Latest Ref Range: 3.5 - 5.1 mEq/L 3.7  Chloride Latest Ref Range: 96 - 112 mEq/L 103  CO2 Latest Ref Range:  19 - 32 mEq/L 28  Glucose Latest Ref Range: 70 - 99 mg/dL 170 (H)  BUN Latest Ref Range: 6 - 23 mg/dL 17  Creatinine Latest Ref Range: 0.40 - 1.20 mg/dL 1.17  Calcium Latest Ref Range: 8.4 - 10.5 mg/dL 9.3  Alkaline Phosphatase Latest Ref Range: 39 - 117 U/L 64  Albumin Latest Ref Range: 3.5 - 5.2 g/dL 4.1  AST Latest Ref Range: 0 - 37 U/L 15  ALT Latest Ref Range: 0 - 35 U/L 21  Total Protein Latest Ref Range: 6.0 - 8.3 g/dL 6.4  Total Bilirubin Latest Ref Range: 0.2 - 1.2 mg/dL 0.3  GFR Latest Ref Range: >60.00 mL/min 58.67 (L)   Results for TIGER, PRIMAS (MRN [id number]) as of 10/13/2019 13:22  Ref. Range 09/10/2019 09:25  Hemoglobin A1C Latest Ref Range: 4.6 - 6.5 % 7.8 (H)  TSH Latest Ref Range: 0.35 - 4.50 uIU/mL 8.73 (H)     ASSESSMENT / PLAN / RECOMMENDATIONS:   1. Hx of Papillary Thyroid Cancer: S/P thyroidectomy and RAI ablation x 2 (2011 &2013):  - Pt had metastatic recurrence to L.N in 2013. Post-treatment scan has been negative - Pt is clinically euthyroid - Thyrogen stimulated WBS and Neck ultrasound were negative as of 04/2019 - So  far she has had excellent structural and biochemical response - TSH is elevated despite increasing LT-4 replacement. She assures me compliance , she takes it after she gets off work, I have advised her to take it after she wakes up, and to use a "pill box"  - She declines labs today  Medications  Levothyroxine 125 mcg daily except Sunday take 2 tablets   2. Post-operative Hypothyroidism : - Clinically euthyroid - TSH goal 0.5-2.0  - Pt educated extensively on the correct way to take levothyroxine (first thing in the morning with water, 30 minutes before eating or taking other medications). - Pt encouraged to double dose the following day if  she were to miss a dose given long half-life of levothyroxine.  Medications  Levothyroxine 125 mcg daily except Sunday take 2 tablets    3. Type 2 Diabetes Mellitus,Sub-Optimally  controled, without complications: 123456 [id number]   GENERAL:  Continues to have suboptimally controlled diabetes, this is due to dietary indiscretions    She has declined any changes today, she would like to work on her diet.    MEDICATIONS:  Continue Metformin 500 mg BID  Continue Trulicity 1.5  mg weekly   EDUCATION / INSTRUCTIONS:  BG monitoring instructions: Patient is instructed to check her blood sugars 2 times a day, fasting and bedtime  Call [facility] if: BG persistently < 70 or > 300.   4) Diabetic complications:   Eye: Does not have known diabetic retinopathy.  Neuro/ Feet: Does not have known diabetic peripheral neuropathy.  Renal: Patient does not have known baseline CKD. She is on an ACEI/ARB at present.    F/U in 2 months   Signed electronically by: Mack Guise, MD  [facility]  [address] [place], [place] [address] Phone: [phone] FAX: [phone]      CC: McLean-Scocuzza, Nino Glow, MD [place] [address] Phone: [phone]  Fax: [phone]   Return to Endocrinology clinic as below: Future Appointments  Date Time Provider [place]  12/07/2019  9:30 AM McLean-Scocuzza, Nino Glow, MD [facility]  12/09/2019  9:10 AM Kyandra Mcclaine, Melanie Crazier, MD [facility] None  12/21/2019  8:00 AM [facility] [place]

## 2019-11-02 IMAGING — DX DG THORACIC SPINE 2V
2 series · 2 of 2 positions shown · non-contrast
Comparison: None.

CLINICAL DATA: Pt states she has had neck and lower back pain for
several weeks. Pt states she was helping lift a patient at work and
strained her back. Pt denies any other injury or surgery to neck or
back. Due to pt body habitus best obtainable images presented.

EXAM:
THORACIC SPINE 2 VIEWS

[thoracic spine ap]
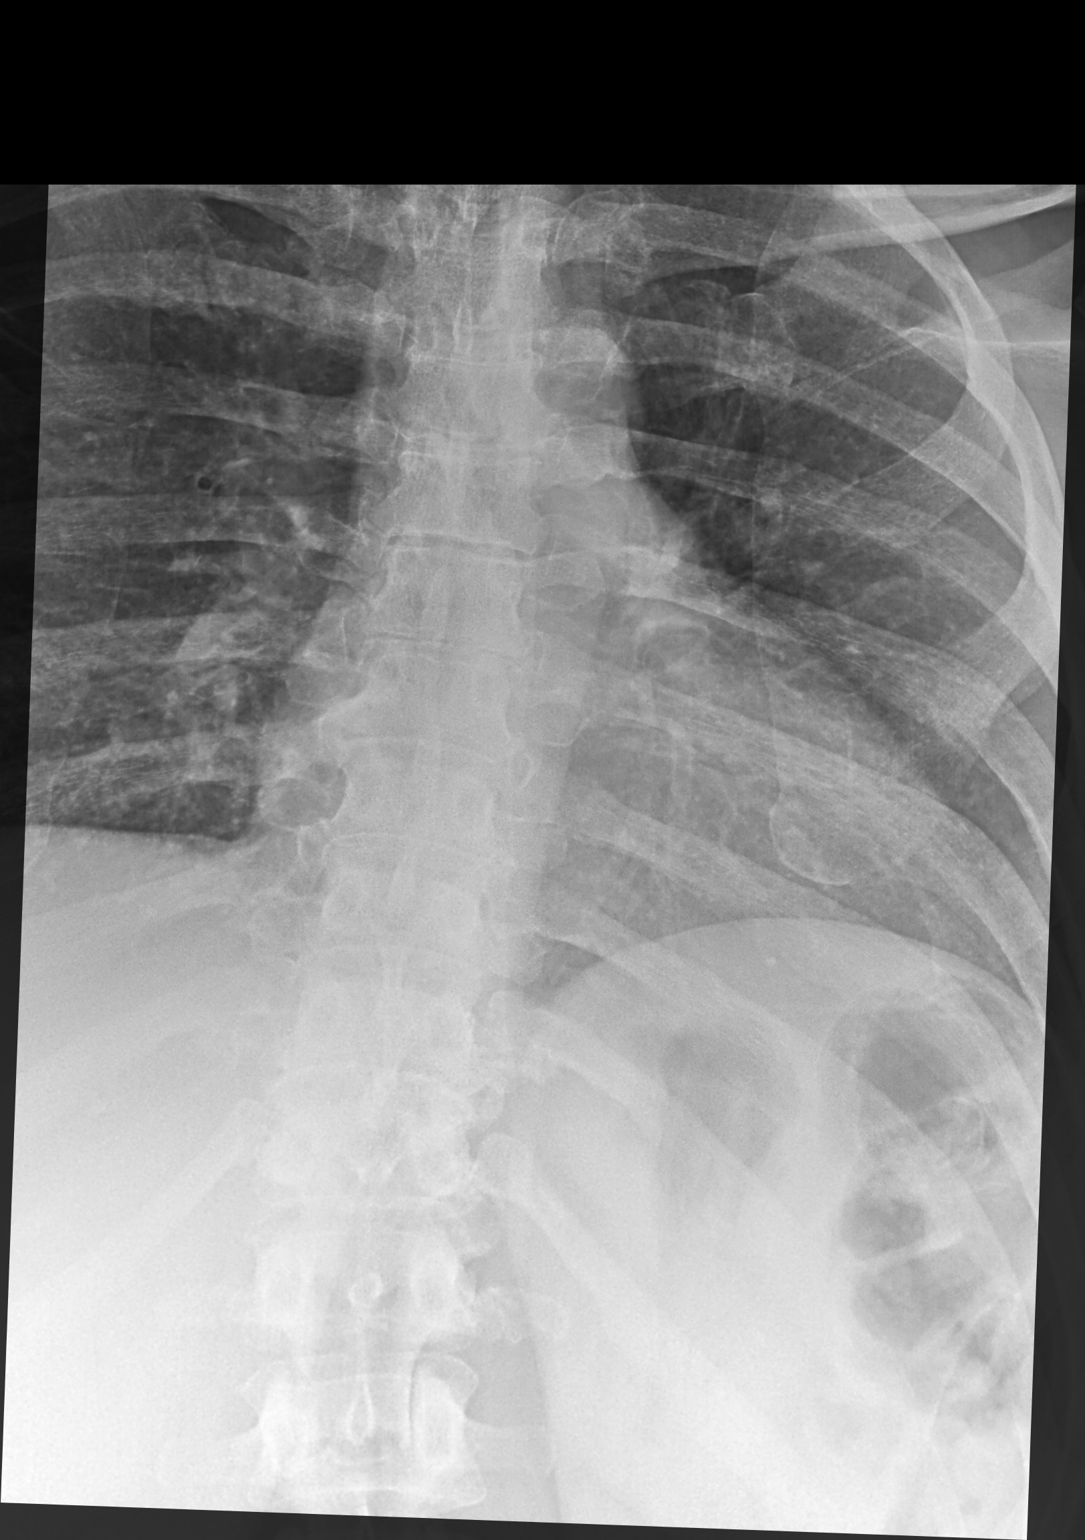

[thoracic spine lat]
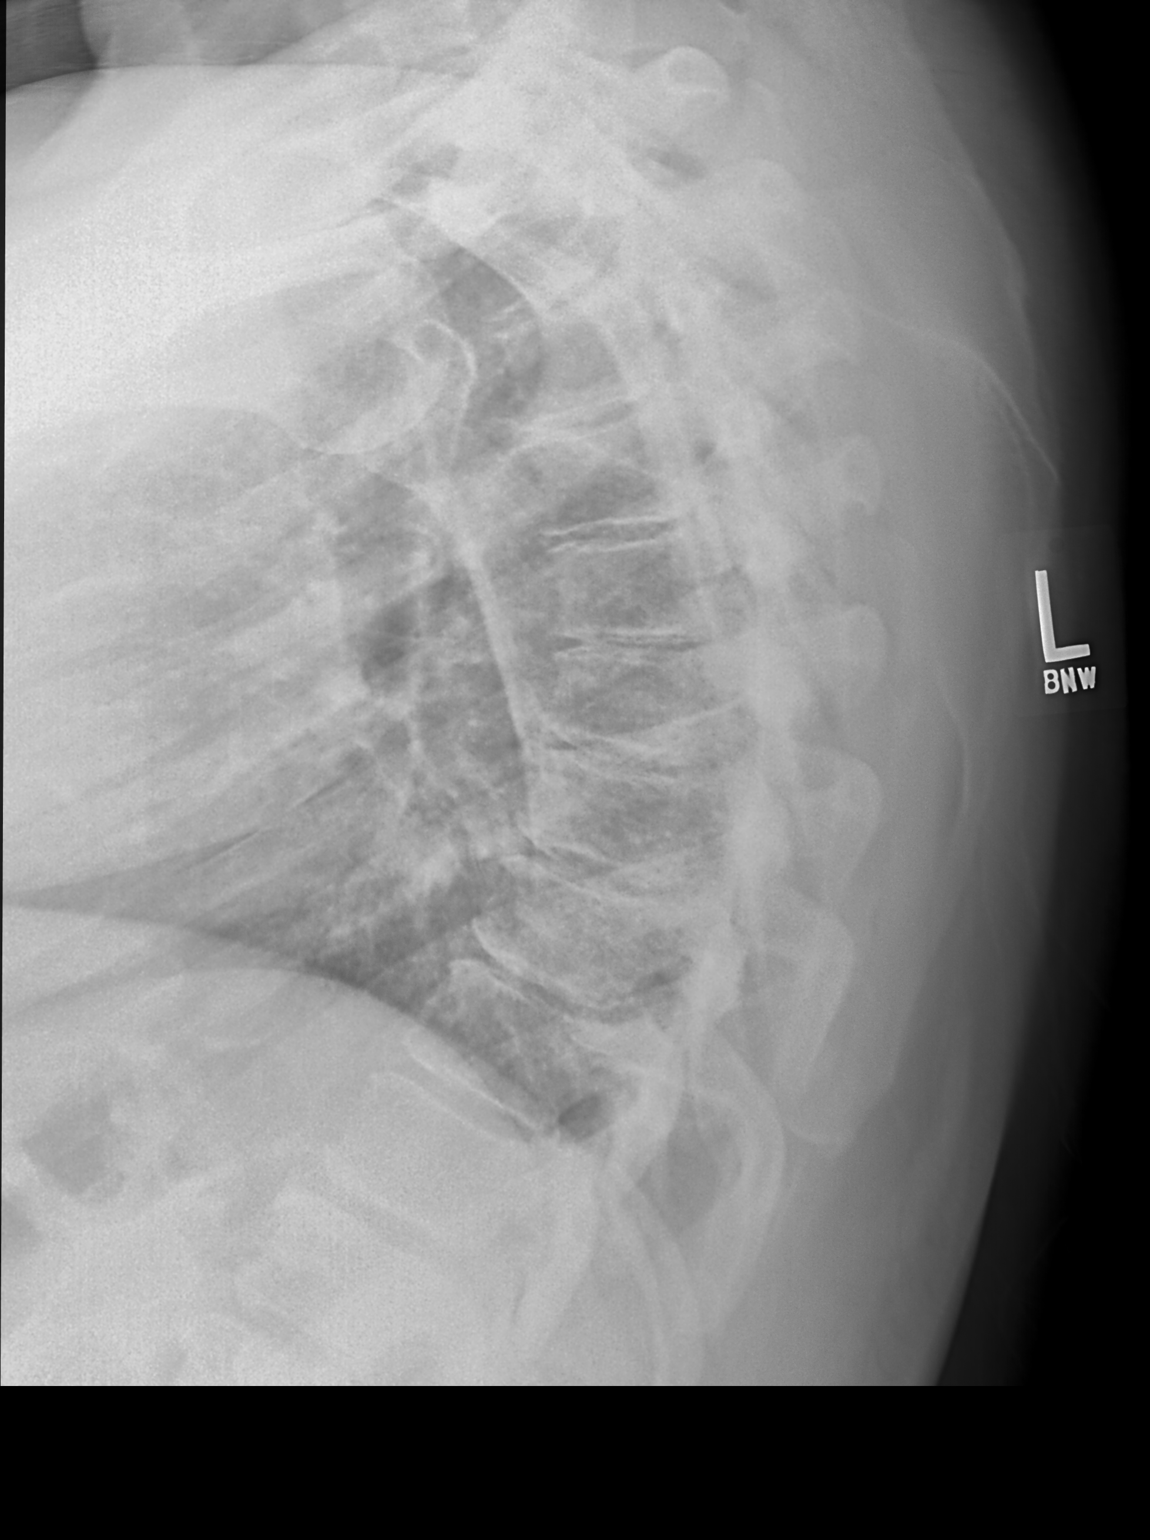

[2 of 2 positions shown; findings below may reference images not displayed]

FINDINGS: There is no evidence of thoracic spine fracture. Alignment is
normal. No other significant bone abnormalities are identified.
IMPRESSION: Negative.

## 2019-11-02 IMAGING — DX DG CERVICAL SPINE COMPLETE 4+V
6 series · 6 of 6 positions shown · non-contrast
Comparison: None.

CLINICAL DATA: Pt states she has had neck and lower back pain for
several weeks. Pt states she was helping lift a patient at work and
strained her back. Pt denies any other injury or surgery to neck or
back. Due to pt body habitus best obtainable images presented.

EXAM:
CERVICAL SPINE - COMPLETE 4+ VIEW

[cervical spine ap]
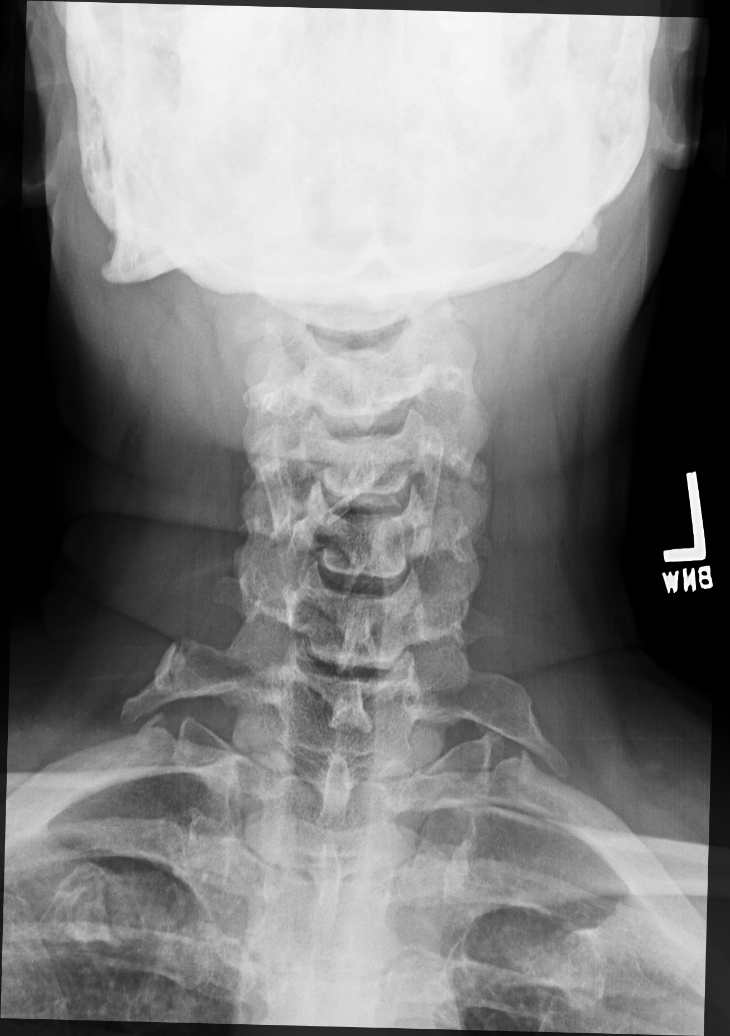

[cervical spine oblique (1 of 2)]
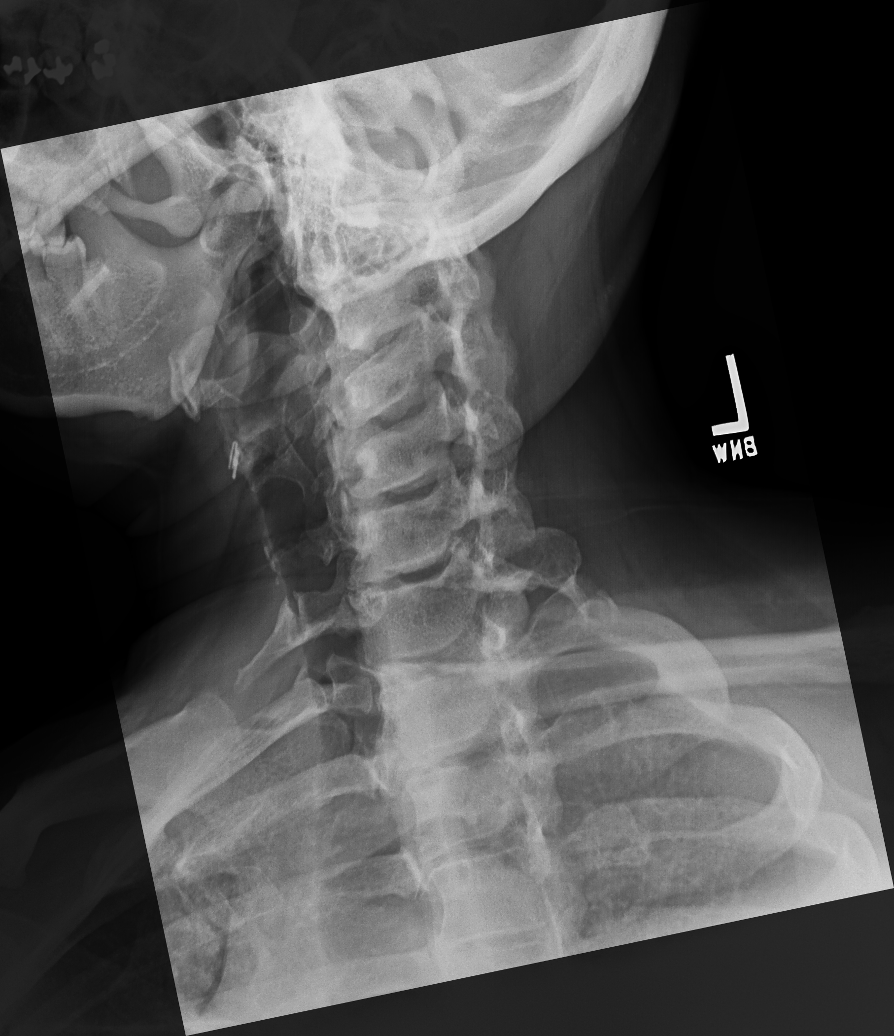

[cervical spine oblique (2 of 2)]
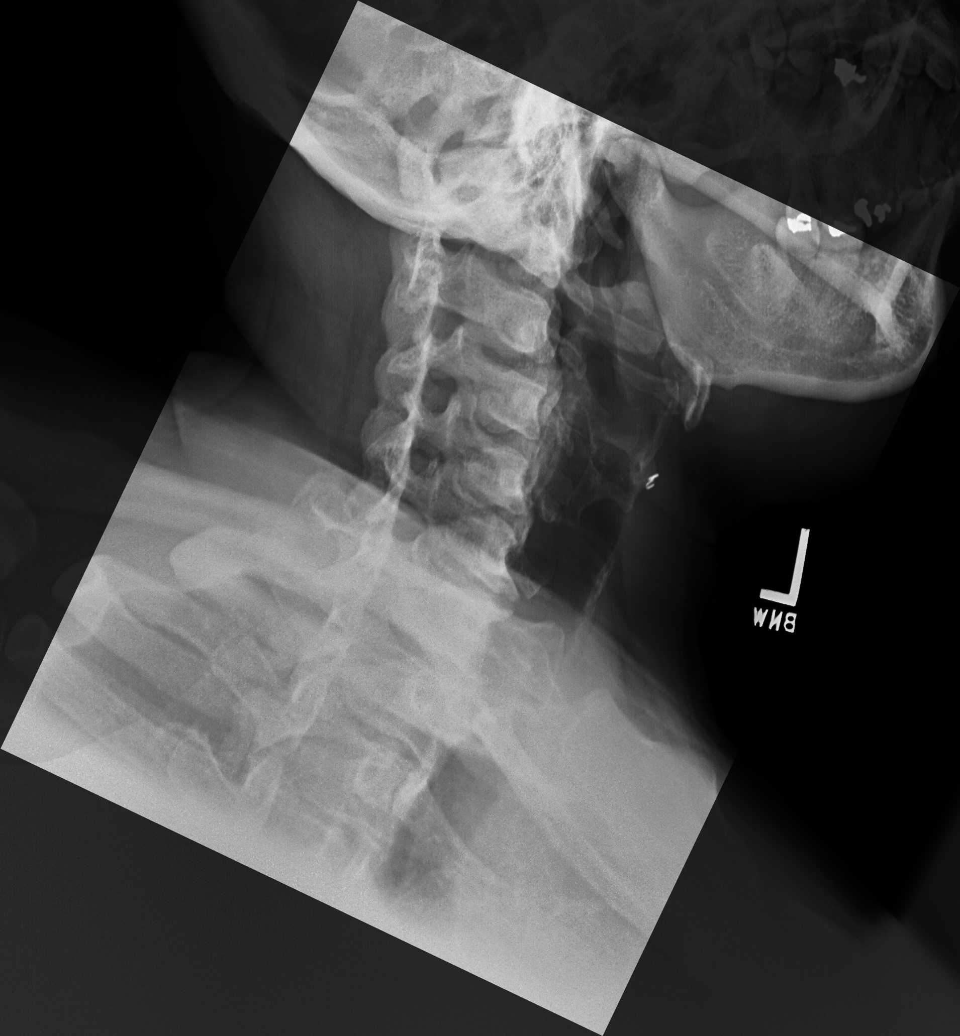

[cervical spine lat]
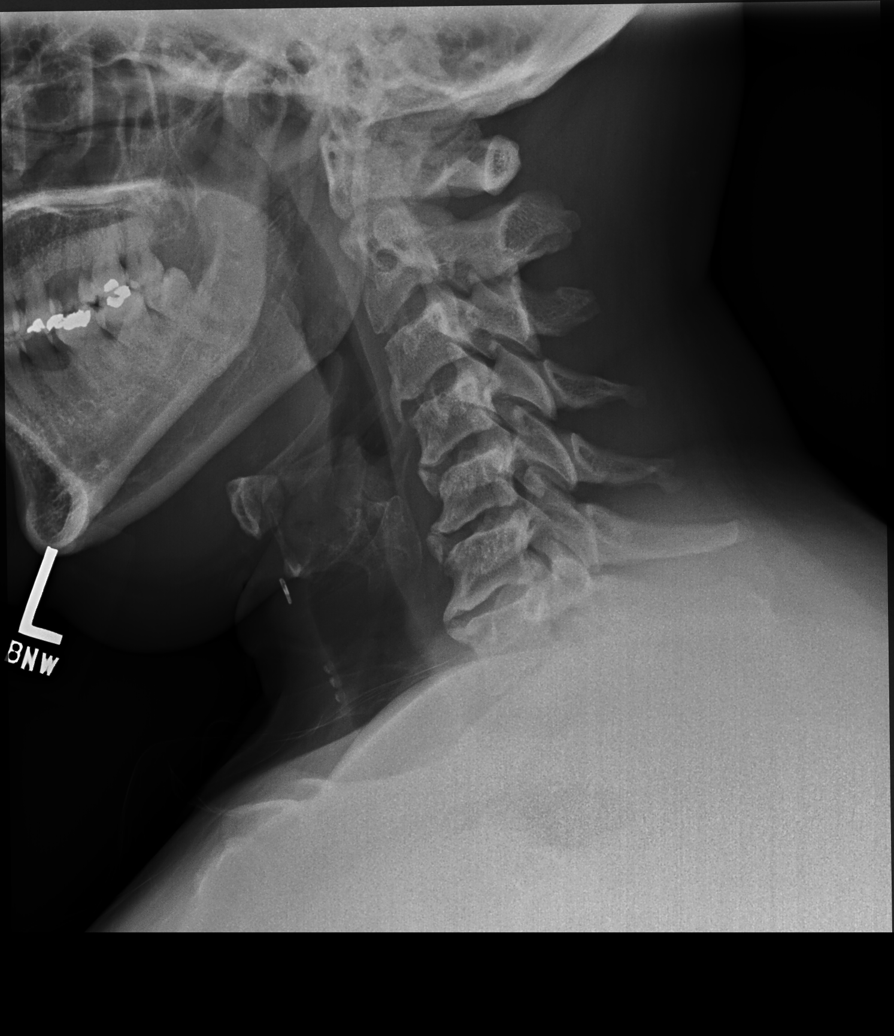

[swimmers lat]
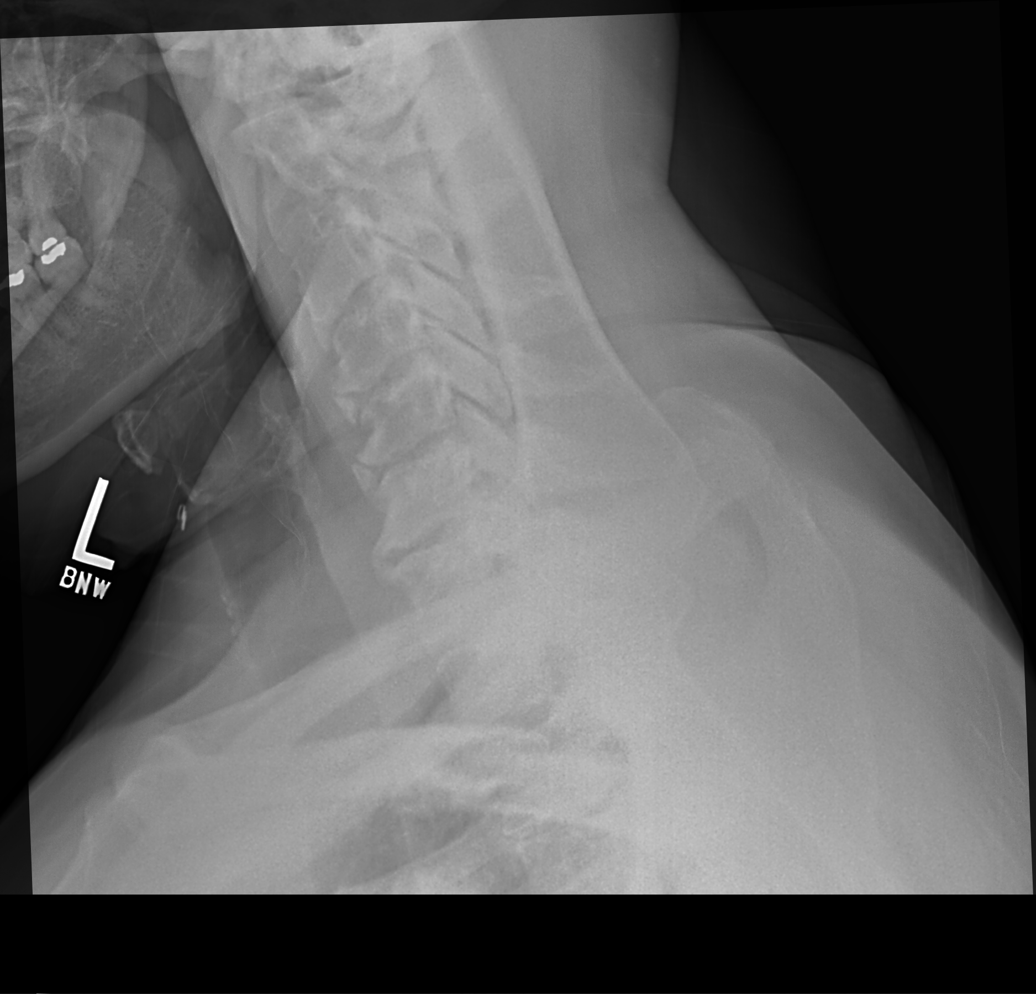

[cervical spine open mouth ap]
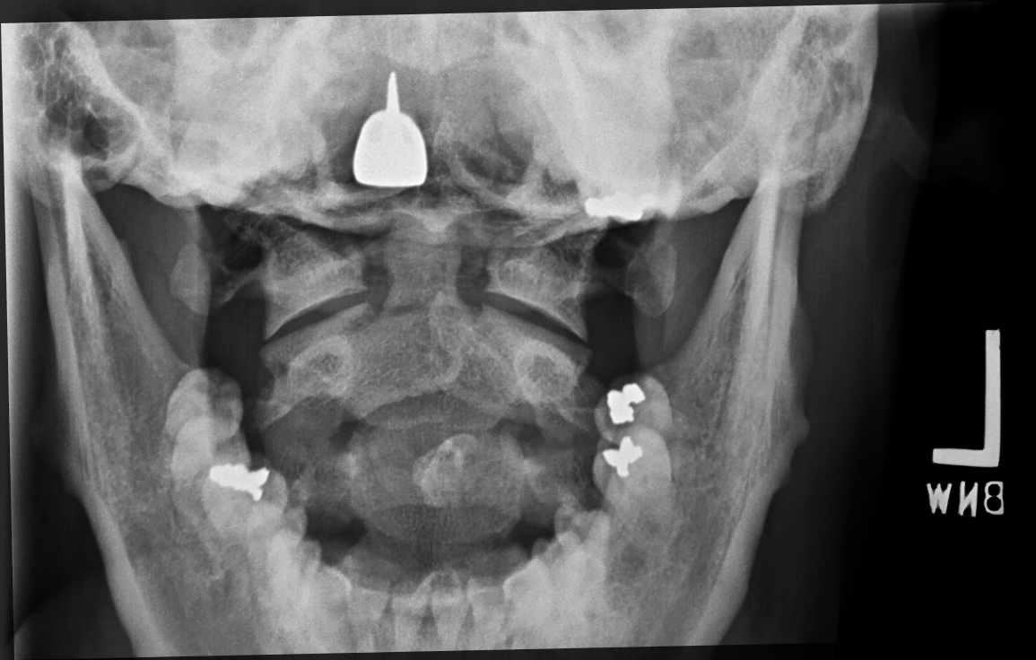

[6 of 6 positions shown; findings below may reference images not displayed]

FINDINGS: There is no evidence of cervical spine fracture or prevertebral soft
tissue swelling. Mild reversal of the normal cervical lordosis.
Calcifications in the anterior longitudinal ligament with anterior
endplate spurring C3-C7. There is mild narrowing of the C5-6 and
C6-7 interspaces. Multiple dental restorations. No other significant
bone abnormalities are identified.
IMPRESSION: 1. Negative for fracture or other acute bone abnormality.
2. Degenerative changes C3-C7 as above.
3. Loss of the normal cervical spine lordosis, which may be
secondary to positioning, spasm, or soft tissue injury.

## 2019-11-02 IMAGING — DX DG LUMBAR SPINE COMPLETE 4+V
5 series · 5 of 5 positions shown · non-contrast
Comparison: None.

CLINICAL DATA: Pt states she has had neck and lower back pain for
several weeks. Pt states she was helping lift a patient at work and
strained her back. Pt denies any other injury or surgery to neck or
back. Due to pt body habitus best obtainable images presented.

EXAM:
LUMBAR SPINE - COMPLETE 4+ VIEW

[lumbar spine ap]
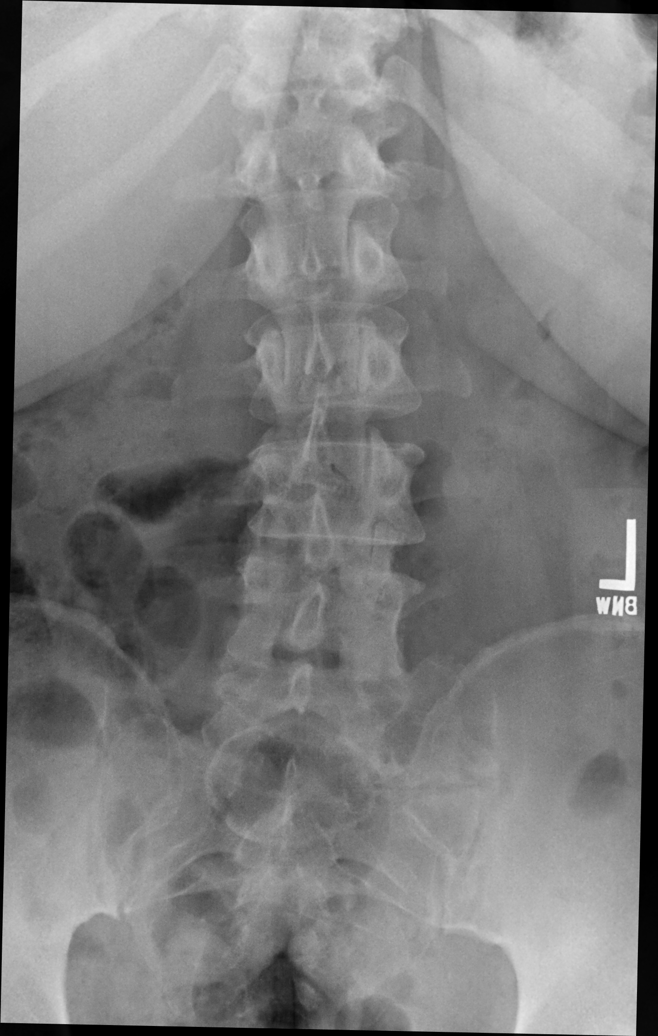

[lumbar spine obl (oblique) (1 of 2)]
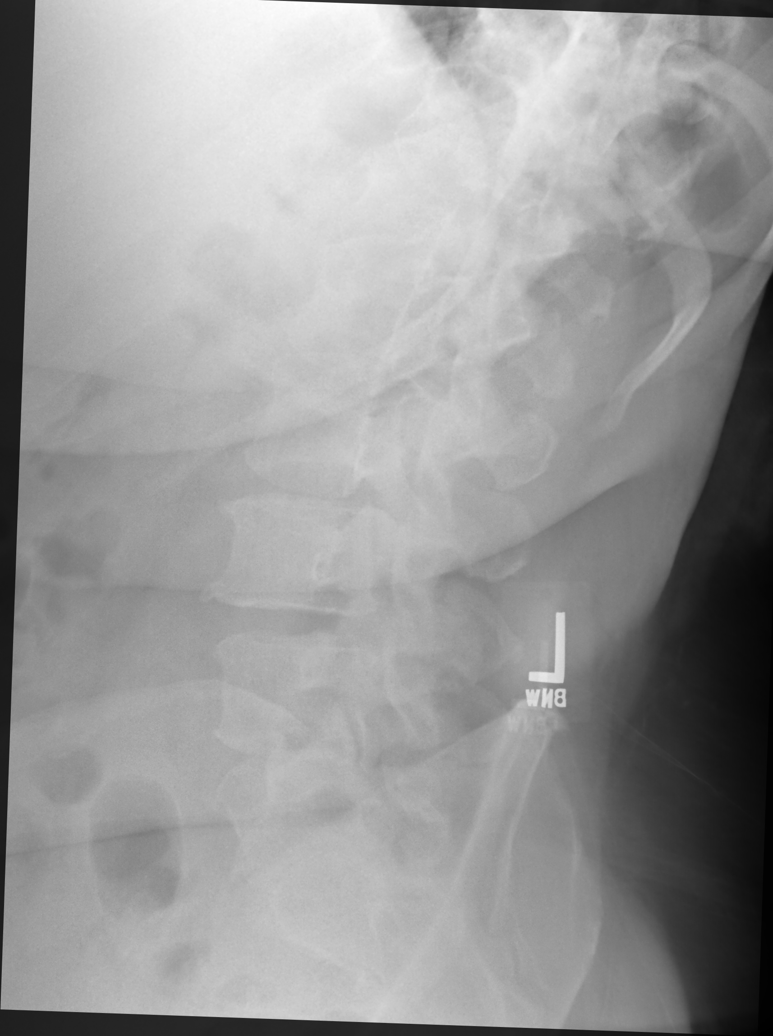

[lumbar spine obl (oblique) (2 of 2)]
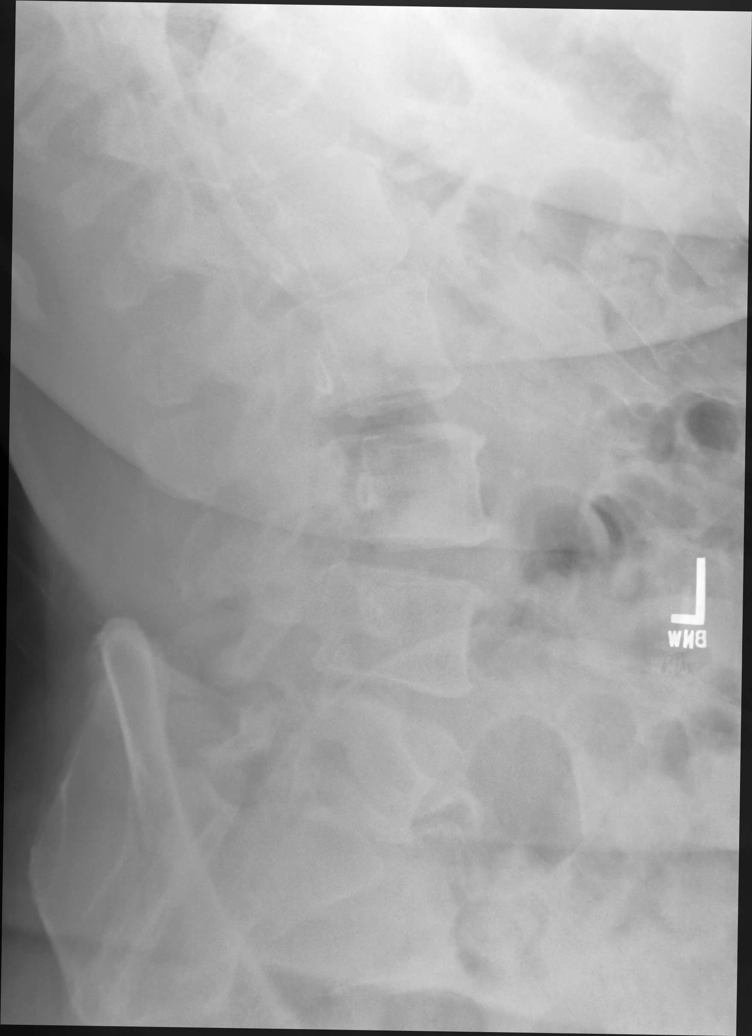

[lumbar spine lat]
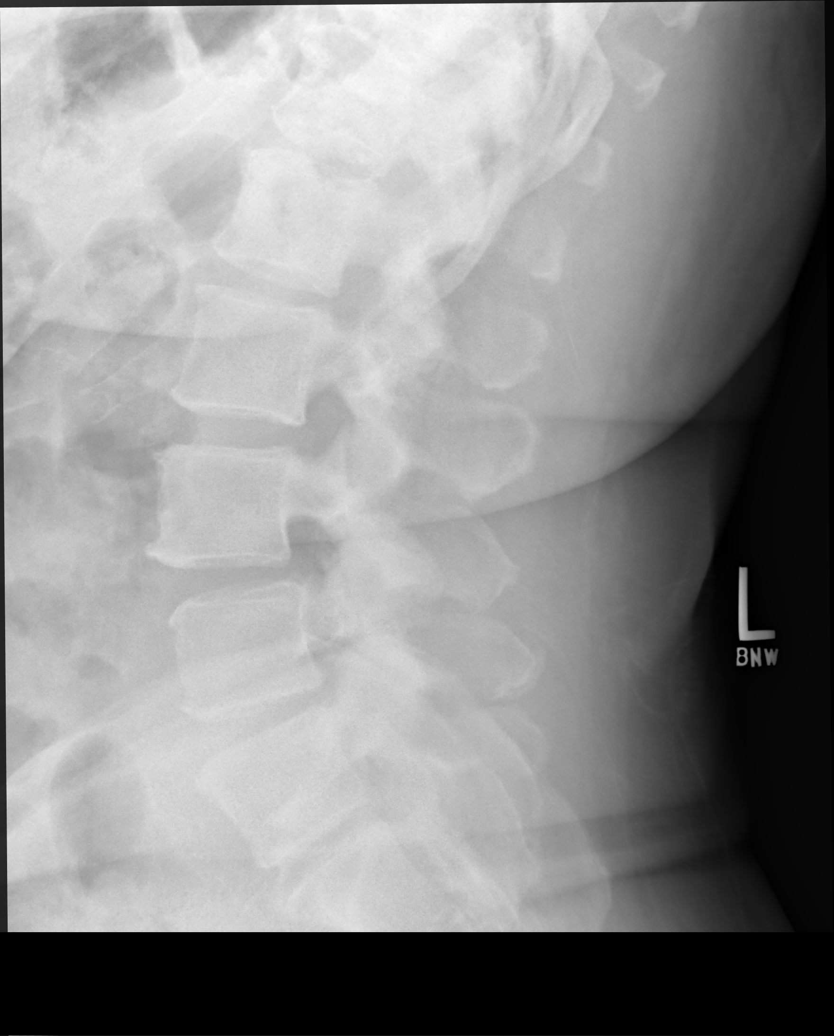

[lumbar spot lat]
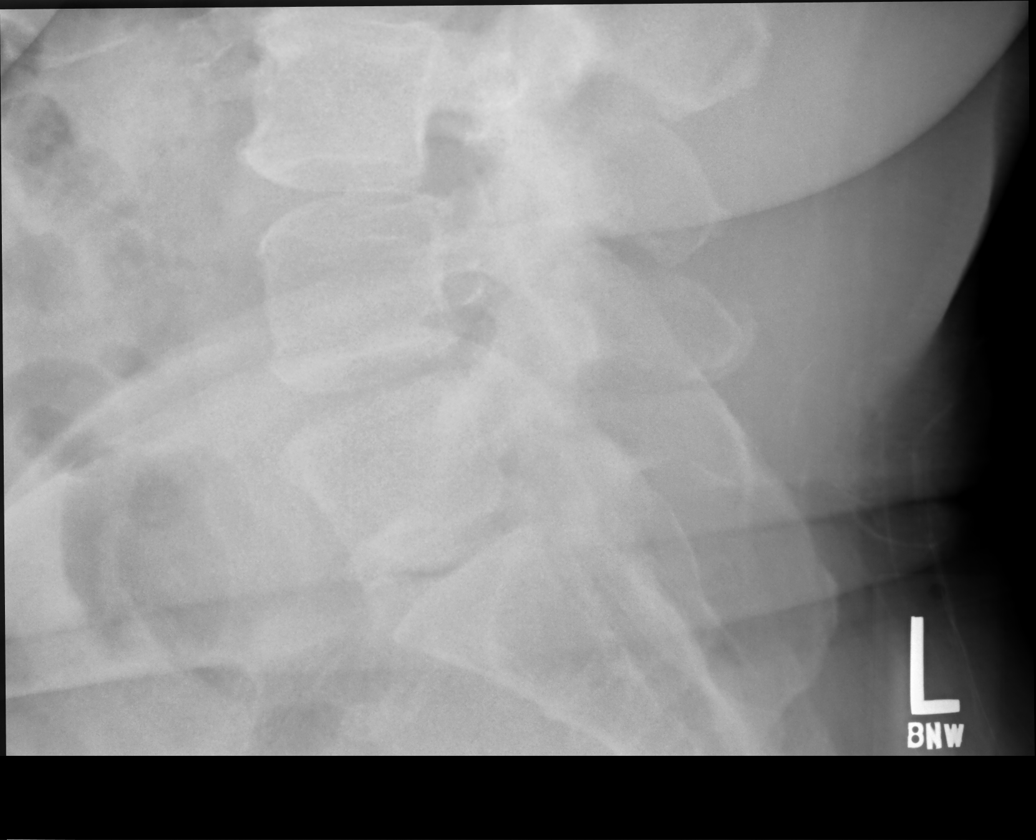

[5 of 5 positions shown; findings below may reference images not displayed]

FINDINGS: Obesity. Mild narrowing of the L4-5 interspace. Vertebral body
height maintained throughout. Normal alignment. Minimal anterior
endplate spurring L2-L5. No other significant osseous degenerative
change.
IMPRESSION: 1. Negative for fracture or other acute bone abnormality.
2. Degenerative disc disease L4-5.

## 2019-12-02 ENCOUNTER — Telehealth: Payer: Self-pay | Admitting: Internal Medicine

## 2019-12-02 NOTE — Telephone Encounter (Signed)
I called pt twice and left vm to call ofc. °

## 2019-12-07 ENCOUNTER — Other Ambulatory Visit: Payer: Self-pay

## 2019-12-07 ENCOUNTER — Ambulatory Visit (INDEPENDENT_AMBULATORY_CARE_PROVIDER_SITE_OTHER): Payer: No Typology Code available for payment source | Admitting: Internal Medicine

## 2019-12-07 ENCOUNTER — Other Ambulatory Visit: Payer: Self-pay | Admitting: Internal Medicine

## 2019-12-07 VITALS — Ht 69.0 in | Wt 247.0 lb

## 2019-12-07 DIAGNOSIS — J40 Bronchitis, not specified as acute or chronic: Secondary | ICD-10-CM

## 2019-12-07 DIAGNOSIS — Z85850 Personal history of malignant neoplasm of thyroid: Secondary | ICD-10-CM

## 2019-12-07 DIAGNOSIS — E119 Type 2 diabetes mellitus without complications: Secondary | ICD-10-CM

## 2019-12-07 DIAGNOSIS — E559 Vitamin D deficiency, unspecified: Secondary | ICD-10-CM

## 2019-12-07 DIAGNOSIS — B373 Candidiasis of vulva and vagina: Secondary | ICD-10-CM | POA: Diagnosis not present

## 2019-12-07 DIAGNOSIS — E890 Postprocedural hypothyroidism: Secondary | ICD-10-CM

## 2019-12-07 DIAGNOSIS — B3731 Acute candidiasis of vulva and vagina: Secondary | ICD-10-CM

## 2019-12-07 DIAGNOSIS — C73 Malignant neoplasm of thyroid gland: Secondary | ICD-10-CM

## 2019-12-07 MED ORDER — AZITHROMYCIN 250 MG PO TABS: ORAL_TABLET | ORAL | 0 refills | Status: DC

## 2019-12-07 MED ORDER — FLUCONAZOLE 150 MG PO TABS: 150.0000 mg | ORAL_TABLET | Freq: Once | ORAL | 0 refills | Status: AC

## 2019-12-07 MED ORDER — ALBUTEROL SULFATE HFA 108 (90 BASE) MCG/ACT IN AERS: 1.0000 | INHALATION_SPRAY | RESPIRATORY_TRACT | 11 refills | Status: DC | PRN

## 2019-12-07 MED ORDER — CHOLECALCIFEROL 1.25 MG (50000 UT) PO CAPS: 50000.0000 [IU] | ORAL_CAPSULE | ORAL | 1 refills | Status: DC

## 2019-12-07 MED ORDER — HYDROCOD POLST-CPM POLST ER 10-8 MG/5ML PO SUER: 5.0000 mL | Freq: Every evening | ORAL | 0 refills | Status: DC | PRN

## 2019-12-07 NOTE — Progress Notes (Signed)
Telephone Note I connected with Kerry Davis  on 12/07/19 at  10:00 AM EST by telephone and verified that I am speaking with the correct person using two identifiers.  Location patient: car Location provider:work or home office Persons participating in the virtual visit: patient, provider  I discussed the limitations of evaluation and management by telemedicine and the availability of in person appointments. The patient expressed understanding and agreed to proceed.   HPI: 1. C/o cough with mucous and phelgm and sneezing noted since 2nd dose of covid vaccine 1/11 or 11/29/18 she is smoking cigarettes, denies sob, wheezing, and taking otc cough meds has tried nyquil, xyzal not helping   2. H/o thyroid cancer and DM 2 f/u 12/09/19 with endocrine on levothyroxine 2 pills on Sunday 250 mcg Sunday and 1 pill other days.    Ref. Range 07/14/2019 09:50 09/10/2019 09:25  TSH Latest Ref Range: 0.35 - 4.50 uIU/mL 5.19 (H) 8.73 (H)  T4,Free(Direct) Latest Ref Range: 0.60 - 1.60 ng/dL 0.92   Thyroglobulin Latest Units: ng/mL 3.3   Thyroglobulin Ab Latest Ref Range: < or = 1 IU/mL <1    3. Vitamin D def vitamin D normal will Rx 50K IU 1x per month instead of weekly   ROS: See pertinent positives and negatives per HPI.  Past Medical History:  Diagnosis Date  . Anxiety   . Asthma   . Cancer ([place])    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 saliva production   . Colon polyps   . Depression   . Diabetes mellitus without complication ([place])   . GERD (gastroesophageal reflux disease)   . Herpes    1+2  . Hyperlipidemia   . Hypertension   . OSA on CPAP   . Thyroid disease     Past Surgical History:  Procedure Laterality Date  . COLONOSCOPY WITH PROPOFOL N/A 09/15/2018   Procedure: COLONOSCOPY WITH PROPOFOL;  Surgeon: [name], MD;  Location: [facility] ENDOSCOPY;  Service: Endoscopy;  Laterality: N/A;  . DILATION AND CURETTAGE OF UTERUS    . LEEP    . SALIVARY GLAND SURGERY     left   . THYROIDECTOMY    . TUBAL LIGATION      Family History  Problem Relation Age of Onset  . Cancer Mother        breast  . Diabetes Mother   . Heart disease Mother        CHF  . Hypertension Mother   . Hyperparathyroidism Mother   . Diabetes Father   . Heart disease Father        CHF  . Hypertension Father   . Thyroid nodules Daughter   . Hypertension Son        ?  . Stroke Maternal Grandmother   . Cancer Maternal Grandmother        colon   . Cancer Paternal Grandfather        thyroid  . Diabetes Daughter        2    SOCIAL HX:  3 kids (2 girls and 1 boy) RN med surgery [employer]  Divorced now single    Current Outpatient Medications:  .  albuterol (VENTOLIN HFA) 108 (90 Base) MCG/ACT inhaler, Inhale 1-2 puffs into the lungs every 4 (four) hours as needed for wheezing or shortness of breath., Disp: 18 g, Rfl: 11 .  ALPRAZolam (XANAX) 1 MG tablet, Take 1 tablet (1 mg total) by mouth daily as needed for anxiety., Disp:  30 tablet, Rfl: 5 .  atorvastatin (LIPITOR) 10 MG tablet, Take 1 tablet (10 mg total) by mouth daily at 6 PM., Disp: 90 tablet, Rfl: 3 .  azithromycin (ZITHROMAX) 250 MG tablet, 2 pills day 1 and 1 pill day 2-5, Disp: 6 tablet, Rfl: 0 .  Blood Glucose Monitoring Suppl ([place]) DEVI, Ok to fill per [employer] formulary, Disp: , Rfl:  .  chlorpheniramine-HYDROcodone (TUSSIONEX PENNKINETIC ER) 10-8 MG/5ML SUER, Take 5 mLs by mouth at bedtime as needed for cough., Disp: 140 mL, Rfl: 0 .  Cholecalciferol 1.25 MG (50000 UT) capsule, Take 1 capsule (50,000 Units total) by mouth every 30 (thirty) days., Disp: 8 capsule, Rfl: 1 .  cyclobenzaprine (FLEXERIL) 5 MG tablet, Take 1 tablet (5 mg total) by mouth at bedtime as needed for muscle spasms., Disp: 30 tablet, Rfl: 11 .  Dulaglutide (TRULICITY) 1.5 AY/3.0ZS SOPN, Inject 1.5 mg into the skin once a week., Disp: 4 pen, Rfl: 11 .  enalapril (VASOTEC) 10 MG tablet, Take 1 tablet (10 mg  total) by mouth daily., Disp: 90 tablet, Rfl: 3 .  fluconazole (DIFLUCAN) 150 MG tablet, Take 1 tablet (150 mg total) by mouth once for 1 dose., Disp: 1 tablet, Rfl: 0 .  FREESTYLE LITE test strip, , Disp: , Rfl: 3 .  hydrochlorothiazide (HYDRODIURIL) 25 MG tablet, Take 1 tablet (25 mg total) by mouth daily., Disp: 90 tablet, Rfl: 3 .  Lancets (FREESTYLE) lancets, , Disp: , Rfl: 3 .  levothyroxine (SYNTHROID) 125 MCG tablet, Take 1 tablet (125 mcg total) by mouth as directed. 8 tablets weekly, Disp: 98 tablet, Rfl: 2 .  metFORMIN (GLUCOPHAGE) 500 MG tablet, Take 1 tablet (500 mg total) by mouth 2 (two) times daily with a meal., Disp: 180 tablet, Rfl: 3 .  omeprazole (PRILOSEC) 40 MG capsule, Take 1 capsule (40 mg total) by mouth daily., Disp: 90 capsule, Rfl: 3 .  traZODone (DESYREL) 100 MG tablet, Take 1 tablet (100 mg total) by mouth at bedtime., Disp: 90 tablet, Rfl: 3 .  venlafaxine XR (EFFEXOR-XR) 75 MG 24 hr capsule, Take 1 capsule (75 mg total) by mouth daily with breakfast., Disp: 90 capsule, Rfl: 3  EXAM:  VITALS per patient if applicable:  GENERAL: alert, oriented, appears well and in no acute distress  PSYCH/NEURO: pleasant and cooperative, no obvious depression or anxiety, speech and thought processing grossly intact  ASSESSMENT AND PLAN:  Discussed the following assessment and plan: Bronchitis - Plan: albuterol (VENTOLIN HFA) 108 (90 Base) MCG/ACT inhaler, chlorpheniramine-HYDROcodone (TUSSIONEX PENNKINETIC ER) 10-8 MG/5ML SUER, azithromycin (ZITHROMAX) 250 MG tablet rec smoking cessation  Prn Diflucan   Vitamin D deficiency - Plan: Cholecalciferol 1.25 MG (50000 UT) capsule 1x per month   Type 2 diabetes mellitus without complication, without long-term current use of insulin (HCC) A1C 7.8 09/10/19  F/u endocrine referral placed appt 12/09/19  History of thyroid cancer 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 On levo 250 mcg sundays and  125 other days qam  F/u [facility] 12/09/19  Will need repeat thyroid labs tests  HM Flu shothad 08/09/19 Tdap had 12/08/15  pna 23 utd  Hep A/B vx hadhep B immune covid 2/2 last shot had 11/29/18 pfizer arm sore and joint pain, sneezing mild h/a, nose running and cough with phelgm see above  Disc shingrix vaccinehold vaccinept wants to think about it Per pt check MMR and immune  Due colonoscopy h/o polypsnoted 09/18/15 pt wants repeat due to [place] mGM  colon cancer  -09/15/18 normal repeat in 5 years with FH Pap at f/u h/o abnormal pap and LEEP -08/25/18 negative negative HPV Mammogram2/15/19 negreferred pt to schedule  Smoker 1/2 ppd rec cessation rec cessation  -we discussed possible serious and likely etiologies, options for evaluation and workup, limitations of telemedicine visit vs in person visit, treatment, treatment risks and precautions. Pt prefers to treat via telemedicine empirically rather then risking or undertaking an in person visit at this moment. Patient agrees to seek prompt in person care if worsening, new symptoms arise, or if is not improving with treatment.   I discussed the assessment and treatment plan with the patient. The patient was provided an opportunity to ask questions and all were answered. The patient agreed with the plan and demonstrated an understanding of the instructions.   The patient was advised to call back or seek an in-person evaluation if the symptoms worsen or if the condition fails to improve as anticipated.  Time spent 25 min minutes  Delorise Jackson, MD

## 2019-12-09 ENCOUNTER — Ambulatory Visit: Payer: No Typology Code available for payment source | Admitting: Internal Medicine

## 2019-12-20 ENCOUNTER — Other Ambulatory Visit: Payer: Self-pay | Admitting: Internal Medicine

## 2019-12-20 ENCOUNTER — Telehealth: Payer: Self-pay | Admitting: Internal Medicine

## 2019-12-20 ENCOUNTER — Encounter: Payer: Self-pay | Admitting: Internal Medicine

## 2019-12-20 DIAGNOSIS — J40 Bronchitis, not specified as acute or chronic: Secondary | ICD-10-CM

## 2019-12-20 MED ORDER — HYDROCOD POLST-CPM POLST ER 10-8 MG/5ML PO SUER: 5.0000 mL | Freq: Every evening | ORAL | 0 refills | Status: DC | PRN

## 2019-12-20 NOTE — Telephone Encounter (Signed)
DOES SHE NEED TO BE TESTED FOR COVID 19?  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

## 2019-12-20 NOTE — Telephone Encounter (Signed)
I sent her a my chart message to get a chest Xray at [facility]   Thanks  [place]

## 2019-12-20 NOTE — Telephone Encounter (Signed)
Patient says she coughs at work , she does not want to be tested for COVID its just takes her a while to get over the cough from Bronchitis and that she has not stopped smoking but is working on it.

## 2019-12-20 NOTE — Telephone Encounter (Signed)
Pt is requesting a refill on chlorpheniramine-HYDROcodone (TUSSIONEX PENNKINETIC ER) 10-8 MG/5ML SURE to be sent to [place].

## 2019-12-21 ENCOUNTER — Ambulatory Visit
Admission: RE | Admit: 2019-12-21 | Discharge: 2019-12-21 | Disposition: A | Discharge status: Home / Self Care | Payer: No Typology Code available for payment source | Source: Ambulatory Visit | Attending: Internal Medicine | Admitting: Internal Medicine

## 2019-12-21 DIAGNOSIS — Z1231 Encounter for screening mammogram for malignant neoplasm of breast: Secondary | ICD-10-CM | POA: Diagnosis present

## 2019-12-21 DIAGNOSIS — J40 Bronchitis, not specified as acute or chronic: Secondary | ICD-10-CM

## 2019-12-27 ENCOUNTER — Other Ambulatory Visit: Payer: Self-pay

## 2019-12-27 ENCOUNTER — Encounter: Payer: Self-pay | Admitting: Internal Medicine

## 2019-12-27 ENCOUNTER — Other Ambulatory Visit: Payer: Self-pay | Admitting: *Deleted

## 2019-12-27 ENCOUNTER — Inpatient Hospital Stay
Admission: RE | Admit: 2019-12-27 | Discharge: 2019-12-27 | Disposition: A | Discharge status: Home / Self Care | Payer: Self-pay | Source: Ambulatory Visit | Attending: *Deleted | Admitting: *Deleted

## 2019-12-27 ENCOUNTER — Ambulatory Visit: Payer: No Typology Code available for payment source | Admitting: Internal Medicine

## 2019-12-27 VITALS — BP 138/88 | HR 98 | Temp 98.4°F | Ht 69.0 in | Wt 236.4 lb

## 2019-12-27 DIAGNOSIS — C73 Malignant neoplasm of thyroid gland: Secondary | ICD-10-CM

## 2019-12-27 DIAGNOSIS — Z1231 Encounter for screening mammogram for malignant neoplasm of breast: Secondary | ICD-10-CM

## 2019-12-27 DIAGNOSIS — E119 Type 2 diabetes mellitus without complications: Secondary | ICD-10-CM

## 2019-12-27 DIAGNOSIS — E890 Postprocedural hypothyroidism: Secondary | ICD-10-CM

## 2019-12-27 LAB — TSH: TSH: 0.03 u[IU]/mL — ABNORMAL LOW (ref 0.35–4.50)

## 2019-12-27 LAB — POCT GLYCOSYLATED HEMOGLOBIN (HGB A1C): Hemoglobin A1C: 6.3 % — AB (ref 4.0–5.6)

## 2019-12-27 LAB — GLUCOSE, POCT (MANUAL RESULT ENTRY): POC Glucose: 101 mg/dl — AB (ref 70–99)

## 2019-12-27 LAB — T4, FREE: Free T4: 1.55 ng/dL (ref 0.60–1.60)

## 2019-12-27 NOTE — Patient Instructions (Signed)
-   Metformin 500 mg , TWO tablets daily  -  Trulicity 1.5 mg  Weekly      HOW TO TREAT LOW BLOOD SUGARS (Blood sugar LESS THAN 70 MG/DL)  Please follow the RULE OF 15 for the treatment of hypoglycemia treatment (when your (blood sugars are less than 70 mg/dL)    STEP 1: Take 15 grams of carbohydrates when your blood sugar is low, which includes:   3-4 GLUCOSE TABS  OR  3-4 OZ OF JUICE OR REGULAR SODA OR  ONE TUBE OF GLUCOSE GEL     STEP 2: RECHECK blood sugar in 15 MINUTES STEP 3: If your blood sugar is still low at the 15 minute recheck --> then, go back to STEP 1 and treat AGAIN with another 15 grams of carbohydrates.

## 2019-12-27 NOTE — Progress Notes (Signed)
Name: Kerry Davis  MRN/ DOB: [id number], 05-15-1966    Age/ Sex: 54 y.o., female     PCP: McLean-Scocuzza, Nino Glow, MD   Reason for Endocrinology Evaluation: Hx of papillary thyroid cancer      Initial Endocrinology Clinic Visit: 10/08/2018    PATIENT IDENTIFIER: Kerry Davis is a 54 y.o., female with a past medical history of spondylolesthesis, asthma, depression, T2DM, GAD, HTN, papillary thyroid cancer She has followed with [facility] since 10/08/2018 for consultative assistance with management of her T2DM and papillary thyroid carcinoma.   THYROID HISTORICAL SUMMARY: She was diagnosed with follicular with papillary variant in 2011 12/2009 right lobectomy, tumor 1.6 cm, infiltrative and involved margins, one L.N negative) . 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, follicular subtype) [id number] 99 mCi RAI  04/2010 post therapy whole body scan with uptake in the neck  06/2012 U/S revealed an enlarged level 6 L.N 06/2012 surgical resection of recurrent metastatic papillary cancer 3/3 (largest 2.2 cm) 08/2012 149 mCi RAI  09/2012 post-therapy WBS negative for abnormal uptake  Tg remained low through out  10/2013 Thyrogen stimulated WBS was negative and Tg 0.2, PET showed + neck L.N  11/2013 ultrasound stable 05/2014 U/S stable with sub-centimeter nodes 10/2014 I-131 WBS negative, PET scan stable "Grossly unchanged appearance of multiple bilateral 3-6 mm mildly FDG avid cervical lymph nodes 05/2015 : U/S no evidence of residual thyroid tissue , unremarkable L.N . No lesion requiring biopsy 08/2016 Thyrogen stimulated TG was 1.5 (negative) per records.  05/07/2019: Negative Thyrogen stimulated WBS. No evidence of residual disease on [place]. TSH 13000 uIU/mL, somehow the Tg was not drawn but Tg Ab was undetectable.   DIABETES HISTORICAL HISTORY : She was diagnosed with T2DM in 2011.  On her initial presentation to our clinic she was  on metformin and Trulicity, she was on insulin in the past but has been off due to lifestyle changes.Hemoglobin A1c has ranged from 6.9% in 2019, peaking at 15.0% in 2011  SUBJECTIVE:   During last visit (10/13/2019):  Her A1c was 7.8 % we continued metformin and increased  Trulicity.  Increased  levothyroxine to 2 tabs on Sundays of the  125 MCG dose.   Today (12/27/2019):  [name] is here for 3 month folllow up on diabetes and Hx of papillary thyroid cancer.   She continues with chronic hot flashes but these are not interfering with her daily activities. She has noted odynophagia, but no anterior neck swelling Has had intentional weight loss Denies constipation   Cough is improving.    Continues to work 3rd shift.     ROS:  As per HPI.  HOME REGIMEN: Levothyroxine 125 mcg, 2 tabs on Sunday and 1 tab rest of the week  Metformin [id number] mg BID Trulicity 1.5 mg weekly    METER DOWNLOAD SUMMARY: Did not bring meter    DIABETIC COMPLICATIONS: Microvascular complications:    Denies: retinopathy, nephropathy, neuropathy  Last eye exam: Completed 2020  Macrovascular complications:    Denies: CAD, PVD, CVA     HISTORY:  Past Medical History:  Past Medical History:  Diagnosis Date  . Anxiety   . Asthma   . Cancer ([place])    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 saliva production   . Colon polyps   . Depression   . Diabetes mellitus without complication ([place])   . GERD (gastroesophageal reflux disease)   . Herpes    1+2  .  Hyperlipidemia   . Hypertension   . OSA on CPAP   . Thyroid disease    Past Surgical History:  Past Surgical History:  Procedure Laterality Date  . COLONOSCOPY WITH PROPOFOL N/A 09/15/2018   Procedure: COLONOSCOPY WITH PROPOFOL;  Surgeon: [name], MD;  Location: [facility] ENDOSCOPY;  Service: Endoscopy;  Laterality: N/A;  . DILATION AND CURETTAGE OF UTERUS    . LEEP    . SALIVARY GLAND SURGERY     left   . THYROIDECTOMY     . TUBAL LIGATION      Social History:  reports that she has been smoking. She has never used smokeless tobacco. She reports current alcohol use. She reports previous drug use. Family History:  Family History  Problem Relation Age of Onset  . Cancer Mother        breast  . Diabetes Mother   . Heart disease Mother        CHF  . Hypertension Mother   . Hyperparathyroidism Mother   . Breast cancer Mother 27  . Diabetes Father   . Heart disease Father        CHF  . Hypertension Father   . Thyroid nodules Daughter   . Hypertension Son        ?  . Stroke Maternal Grandmother   . Cancer Maternal Grandmother        colon   . Cancer Paternal Grandfather        thyroid  . Diabetes Daughter        2     HOME MEDICATIONS: Allergies as of 12/27/2019   No Known Allergies     Medication List       Accurate as of December 27, 2019 10:22 AM. If you have any questions, ask your nurse or doctor.        albuterol 108 (90 Base) MCG/ACT inhaler Commonly known as: VENTOLIN HFA Inhale 1-2 puffs into the lungs every 4 (four) hours as needed for wheezing or shortness of breath.   ALPRAZolam 1 MG tablet Commonly known as: XANAX Take 1 tablet (1 mg total) by mouth daily as needed for anxiety.   atorvastatin 10 MG tablet Commonly known as: LIPITOR Take 1 tablet (10 mg total) by mouth daily at 6 PM.   chlorpheniramine-HYDROcodone 10-8 MG/5ML Suer Commonly known as: Tussionex Pennkinetic ER Take 5 mLs by mouth at bedtime as needed for cough.   Cholecalciferol 1.25 MG (50000 UT) capsule Take 1 capsule (50,000 Units total) by mouth every 30 (thirty) days.   cyclobenzaprine 5 MG tablet Commonly known as: FLEXERIL Take 1 tablet (5 mg total) by mouth at bedtime as needed for muscle spasms.   enalapril 10 MG tablet Commonly known as: VASOTEC Take 1 tablet (10 mg total) by mouth daily.   freestyle lancets   FREESTYLE LITE test strip Generic drug: glucose blood   GlucoCom Blood  Glucose Monitor Devi Ok to fill per pt's insurance formulary   hydrochlorothiazide 25 MG tablet Commonly known as: HYDRODIURIL Take 1 tablet (25 mg total) by mouth daily.   levothyroxine 125 MCG tablet Commonly known as: SYNTHROID Take 1 tablet (125 mcg total) by mouth as directed. 8 tablets weekly   metFORMIN 500 MG tablet Commonly known as: GLUCOPHAGE Take 1 tablet (500 mg total) by mouth 2 (two) times daily with a meal.   omeprazole 40 MG capsule Commonly known as: PRILOSEC Take 1 capsule (40 mg total) by mouth daily.   traZODone 100 MG tablet  Commonly known as: DESYREL Take 1 tablet (100 mg total) by mouth at bedtime.   Trulicity 1.5 0000000 Sopn Generic drug: Dulaglutide Inject 1.5 mg into the skin once a week.   venlafaxine XR 75 MG 24 hr capsule Commonly known as: EFFEXOR-XR Take 1 capsule (75 mg total) by mouth daily with breakfast.         OBJECTIVE:   PHYSICAL EXAM: VS: BP 138/88 (BP Location: Left Arm, Patient Position: Sitting, Cuff Size: Large)   Pulse 98   Temp 98.4 F (36.9 C)   Ht 5\' 9"  (1.753 m)   Wt 236 lb 6.4 oz (107.2 kg)   SpO2 98%   BMI 34.91 kg/m    EXAM: General: Pt appears well and is in NAD  Neck: General: Supple without adenopathy. Thyroid: Surgically absent.  No nodules appreciated.  Lungs: Clear with good BS bilat with no rales, rhonchi, or wheezes  Heart: Auscultation: RRR.  Abdomen: Normoactive bowel sounds, soft, nontender, without masses or organomegaly palpable  Extremities:  BL LE: No pretibial edema normal ROM and strength.  Mental Status: Judgment, insight: Intact Orientation: Oriented to time, place, and person Mood and affect: No depression, anxiety, or agitation    DM Foot 12/27/2019 The skin of the feet is intact without sores or ulcerations. The pedal pulses are 2+ on right and 2+ on left. The sensation is intact to a screening 5.07, 10 gram monofilament bilaterally    DATA REVIEWED:  Results for AARYANA, MINISH (MRN [id number]) as of 10/13/2019 13:22  Ref. Range 09/10/2019 09:25  Sodium Latest Ref Range: 135 - 145 mEq/L 140  Potassium Latest Ref Range: 3.5 - 5.1 mEq/L 3.7  Chloride Latest Ref Range: 96 - 112 mEq/L 103  CO2 Latest Ref Range: 19 - 32 mEq/L 28  Glucose Latest Ref Range: 70 - 99 mg/dL 170 (H)  BUN Latest Ref Range: 6 - 23 mg/dL 17  Creatinine Latest Ref Range: 0.40 - 1.20 mg/dL 1.17  Calcium Latest Ref Range: 8.4 - 10.5 mg/dL 9.3  Alkaline Phosphatase Latest Ref Range: 39 - 117 U/L 64  Albumin Latest Ref Range: 3.5 - 5.2 g/dL 4.1  AST Latest Ref Range: 0 - 37 U/L 15  ALT Latest Ref Range: 0 - 35 U/L 21  Total Protein Latest Ref Range: 6.0 - 8.3 g/dL 6.4  Total Bilirubin Latest Ref Range: 0.2 - 1.2 mg/dL 0.3  GFR Latest Ref Range: >60.00 mL/min 58.67 (L)   Results for KANDYCE, ROOSEVELT (MRN [id number]) as of 12/28/2019 08:06  Ref. Range 12/27/2019 10:04 12/27/2019 10:23  POC Glucose Latest Ref Range: 70 - 99 mg/dl  101 (A)  Hemoglobin A1C Latest Ref Range: 4.0 - 5.6 %  6.3 (A)  TSH Latest Ref Range: 0.35 - 4.50 uIU/mL 0.03 (L)   T4,Free(Direct) Latest Ref Range: 0.60 - 1.60 ng/dL 1.55     ASSESSMENT / PLAN / RECOMMENDATIONS:   1. Hx of Papillary Thyroid Cancer: S/P thyroidectomy and RAI ablation x 2 (2011 &2013):  - Pt had metastatic recurrence to L.N in 2013. Post-treatment scan has been negative, with the latest scan in June 2020 including Thyrogen stimulated WBS and Neck ultrasound  - Pt is clinically euthyroid - So  far she has had excellent structural response, her Tg increase back in August 2020, this was 2 months after her whole-body scan her TSH at the time was elevated at 5.19 uIU/mL, we increased her L T4 replacement to 2 tablets on Sunday, and to continue 1  tablet the rest of the week but despite this she had a repeat TSH in October 2020, with worsening elevation of her TSH at 8.73 uIU/mL.  We continued the same dose of L T4 replacement and repeat TSH today is  low at 0.03 uIU/mL, her recently  TSH is due to improved adherence to LT-4  Intake.  -I will not make any changes today to her LT-4 -Awaiting on Tg, if continues to be elevated we will consider a PET scan     Medications  Levothyroxine 125 mcg daily except Sundays take 2 tablets   2. Post-operative Hypothyroidism : - Clinically euthyroid - TSH goal 0.5-2.0  - Pt educated extensively on the correct way to take levothyroxine (first thing in the morning with water, 30 minutes before eating or taking other medications). - Pt encouraged to double dose the following day if she were to miss a dose given long half-life of levothyroxine.  Medications  Levothyroxine 125 mcg daily except Sundays take 2 tablets    3. Type 2 Diabetes Mellitus,Optimally controled, without complications: [id number]   -I have praised the patient on lifestyle changes and improved glycemic control. -No changes will be made today   MEDICATIONS:  Continue Metformin 500 mg BID  Continue Trulicity 1.5  mg weekly   EDUCATION / INSTRUCTIONS:  BG monitoring instructions: Patient is instructed to check her blood sugars 2 times a day, fasting and bedtime  Call [facility] if: BG persistently < 70 or > 300.   4) Diabetic complications:   Eye: Does not have known diabetic retinopathy.  Neuro/ Feet: Does not have known diabetic peripheral neuropathy.  Renal: Patient does not have known baseline CKD. She is on an ACEI/ARB at present.    F/U in 3 months   Signed electronically by: Mack Guise, MD  [facility]  [facility] [address] [place], [place] [address] Phone: [phone] FAX: [phone]      CC: McLean-Scocuzza, Nino Glow, MD [place] [address] Phone: [phone]  Fax: [phone]   Return to Endocrinology clinic as below: Future Appointments  Date Time Provider [place]  03/17/2020   9:30 AM McLean-Scocuzza, Nino Glow, MD [id number]

## 2019-12-28 ENCOUNTER — Encounter: Payer: Self-pay | Admitting: Internal Medicine

## 2019-12-28 LAB — THYROGLOBULIN ANTIBODY: Thyroglobulin Ab: <1 IU/mL (ref <=1)

## 2019-12-28 LAB — THYROGLOBULIN LEVEL: Thyroglobulin: 1.7 ng/mL — ABNORMAL LOW

## 2019-12-29 ENCOUNTER — Telehealth: Payer: Self-pay | Admitting: Internal Medicine

## 2019-12-29 DIAGNOSIS — C73 Malignant neoplasm of thyroid gland: Secondary | ICD-10-CM

## 2019-12-29 NOTE — Telephone Encounter (Signed)
Please let her know that her thyroglobulin level ( tumor marker) despite the improvement its still high .   I suggest proceeding with a PET scan.   Order entered to be done at [facility], please FWD to [place], MD  [facility] End[facility]in[facility]  [facility] [address] [place], [place] [address] Phone: [phone] FAX: [phone]

## 2019-12-29 NOTE — Telephone Encounter (Signed)
Lft vm for pt to return call to discuss results

## 2019-12-31 NOTE — Telephone Encounter (Signed)
Forwarding as requested

## 2019-12-31 NOTE — Telephone Encounter (Signed)
Patient returned call at 8:50 AM.  Kerry Davis that when you call back if you do not get her you can leave her a detailed VM on [phone]

## 2019-12-31 NOTE — Telephone Encounter (Signed)
Lft 2 vm's for pt to return call

## 2020-01-04 NOTE — Telephone Encounter (Signed)
Pt is scheduled for PET on 01/13/2020

## 2020-01-04 NOTE — Telephone Encounter (Signed)
FYI

## 2020-01-06 ENCOUNTER — Other Ambulatory Visit: Payer: Self-pay | Admitting: Internal Medicine

## 2020-01-06 DIAGNOSIS — I10 Essential (primary) hypertension: Secondary | ICD-10-CM

## 2020-01-06 MED ORDER — ENALAPRIL MALEATE 10 MG PO TABS: 10.0000 mg | ORAL_TABLET | Freq: Every day | ORAL | 3 refills | Status: DC

## 2020-01-13 ENCOUNTER — Encounter (HOSPITAL_COMMUNITY)
Admission: RE | Admit: 2020-01-13 | Discharge: 2020-01-13 | Disposition: A | Discharge status: Home / Self Care | Payer: No Typology Code available for payment source | Source: Ambulatory Visit | Attending: Internal Medicine | Admitting: Internal Medicine

## 2020-01-13 ENCOUNTER — Other Ambulatory Visit: Payer: Self-pay

## 2020-01-13 DIAGNOSIS — C73 Malignant neoplasm of thyroid gland: Secondary | ICD-10-CM | POA: Diagnosis not present

## 2020-01-13 LAB — GLUCOSE, CAPILLARY: Glucose-Capillary: 105 mg/dL — ABNORMAL HIGH (ref 70–99)

## 2020-01-13 MED ORDER — FLUDEOXYGLUCOSE F - 18 (FDG) INJECTION
11.1000 | Freq: Once | INTRAVENOUS | Status: AC
  Administered 2020-01-13: 09:00:00 11.1 via INTRAVENOUS

## 2020-01-14 MED ORDER — BARIUM SULFATE 0.1 % PO SUSP
ORAL | Status: AC
  Filled 2020-01-14: qty 3

## 2020-02-11 ENCOUNTER — Other Ambulatory Visit: Payer: Self-pay | Admitting: Internal Medicine

## 2020-02-11 DIAGNOSIS — I10 Essential (primary) hypertension: Secondary | ICD-10-CM

## 2020-02-11 MED ORDER — HYDROCHLOROTHIAZIDE 25 MG PO TABS: 25.0000 mg | ORAL_TABLET | Freq: Every day | ORAL | 3 refills | Status: DC

## 2020-02-25 ENCOUNTER — Other Ambulatory Visit: Payer: Self-pay | Admitting: Internal Medicine

## 2020-02-25 DIAGNOSIS — M549 Dorsalgia, unspecified: Secondary | ICD-10-CM

## 2020-02-25 DIAGNOSIS — G8929 Other chronic pain: Secondary | ICD-10-CM

## 2020-02-25 DIAGNOSIS — M542 Cervicalgia: Secondary | ICD-10-CM

## 2020-02-25 MED ORDER — CYCLOBENZAPRINE HCL 5 MG PO TABS: 5.0000 mg | ORAL_TABLET | Freq: Every evening | ORAL | 11 refills | Status: DC | PRN

## 2020-03-17 ENCOUNTER — Encounter: Payer: Self-pay | Admitting: Internal Medicine

## 2020-03-17 ENCOUNTER — Telehealth (INDEPENDENT_AMBULATORY_CARE_PROVIDER_SITE_OTHER): Payer: No Typology Code available for payment source | Admitting: Internal Medicine

## 2020-03-17 VITALS — BP 132/76 | Ht 69.0 in | Wt 227.0 lb

## 2020-03-17 DIAGNOSIS — F419 Anxiety disorder, unspecified: Secondary | ICD-10-CM | POA: Diagnosis not present

## 2020-03-17 DIAGNOSIS — Z720 Tobacco use: Secondary | ICD-10-CM | POA: Insufficient documentation

## 2020-03-17 DIAGNOSIS — R591 Generalized enlarged lymph nodes: Secondary | ICD-10-CM

## 2020-03-17 DIAGNOSIS — R590 Localized enlarged lymph nodes: Secondary | ICD-10-CM

## 2020-03-17 DIAGNOSIS — Z85850 Personal history of malignant neoplasm of thyroid: Secondary | ICD-10-CM

## 2020-03-17 DIAGNOSIS — R918 Other nonspecific abnormal finding of lung field: Secondary | ICD-10-CM

## 2020-03-17 DIAGNOSIS — Z1159 Encounter for screening for other viral diseases: Secondary | ICD-10-CM

## 2020-03-17 DIAGNOSIS — R9389 Abnormal findings on diagnostic imaging of other specified body structures: Secondary | ICD-10-CM

## 2020-03-17 DIAGNOSIS — Z0184 Encounter for antibody response examination: Secondary | ICD-10-CM

## 2020-03-17 DIAGNOSIS — K760 Fatty (change of) liver, not elsewhere classified: Secondary | ICD-10-CM

## 2020-03-17 DIAGNOSIS — E119 Type 2 diabetes mellitus without complications: Secondary | ICD-10-CM

## 2020-03-17 MED ORDER — ALPRAZOLAM 1 MG PO TABS: 1.0000 mg | ORAL_TABLET | Freq: Every day | ORAL | 5 refills | Status: DC | PRN

## 2020-03-17 NOTE — Progress Notes (Signed)
Virtual Visit via Video Note  I connected with Kerry Davis  on 03/17/20 at  9:50 AM EDT by a video enabled telemedicine application and verified that I am speaking with the correct person using two identifiers.  Location patient: car Location provider:work or home office Persons participating in the virtual visit: patient, provider  I discussed the limitations of evaluation and management by telemedicine and the availability of in person appointments. The patient expressed understanding and agreed to proceed.   HPI: 1. Thyroid cancer f/u Dr. Kelton Pillar [facility] endocrine TSH low 0.03 12/27/19 on levo 125 mcg she is thinking about going back to [facility] endocrine and may call and make appt  Recent pet scan +lung nodules and right cervical and left supraclavicular lymph node and she is a smoker  2. Lung nodules and lymph nodes right cervical and left supraclavicular  -pt initially hesitant to schedule CT chest but agreeable now on 03/2020 or after on off days  She is afraid her thyroid cancer has returned and spread to the lungs  She is also a smoker and has lost weight recently and not trying This is increasing anxiety and needs refill of xanax 3. DM 2 A1C 6.3 12/27/19 congratulated on this on metformin bid   ROS: See pertinent positives and negatives per HPI.  Past Medical History:  Diagnosis Date  . Anxiety   . Asthma   . 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 Colon polyps   . Depression   . Diabetes mellitus without complication ([place])   . GERD (gastroesophageal reflux disease)   . Herpes    1+2  . Hyperlipidemia   . Hypertension   . OSA on CPAP   . Thyroid disease     Past Surgical History:  Procedure Laterality Date  . COLONOSCOPY WITH PROPOFOL N/A 09/15/2018   Procedure: COLONOSCOPY WITH PROPOFOL;  Surgeon: [name], MD;  Location: [facility] ENDOSCOPY;  Service: Endoscopy;  Laterality: N/A;  . DILATION AND CURETTAGE OF UTERUS    .  LEEP    . SALIVARY GLAND SURGERY     left   . THYROIDECTOMY    . TUBAL LIGATION      Family History  Problem Relation Age of Onset  . Cancer Mother        breast  . Diabetes Mother   . Heart disease Mother        CHF  . Hypertension Mother   . Hyperparathyroidism Mother   . Breast cancer Mother 94  . Diabetes Father   . Heart disease Father        CHF  . Hypertension Father   . Thyroid nodules Daughter   . Hypertension Son        ?  . Stroke Maternal Grandmother   . Cancer Maternal Grandmother        colon   . Cancer Paternal Grandfather        thyroid  . 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  albuterol (VENTOLIN HFA) 108 (90 Base) MCG/ACT inhaler, Inhale 1-2 puffs into the lungs every 4 (four) hours as needed for wheezing or shortness of breath., Disp: 18 g, Rfl: 11 .  ALPRAZolam (XANAX) 1 MG tablet, Take 1 tablet (1 mg total) by mouth daily as needed for  anxiety., Disp: 30 tablet, Rfl: 5 .  atorvastatin (LIPITOR) 10 MG tablet, Take 1 tablet (10 mg total) by mouth daily at 6 PM., Disp: 90 tablet, Rfl: 3 .  Blood Glucose Monitoring Suppl (GLUCOCOM BLOOD GLUCOSE MONITOR) DEVI, Ok to fill per [employer] formulary, Disp: , Rfl:  .  Cholecalciferol (VITAMIN D3) 250 MCG (10000 UT) TABS, Take by mouth., Disp: , Rfl:  .  cyclobenzaprine (FLEXERIL) 5 MG tablet, Take 1 tablet (5 mg total) by mouth at bedtime as needed for muscle spasms., Disp: 30 tablet, Rfl: 11 .  Dulaglutide (TRULICITY) 1.5 YI/5.0YD SOPN, Inject 1.5 mg into the skin once a week., Disp: 4 pen, Rfl: 11 .  enalapril (VASOTEC) 10 MG tablet, Take 1 tablet (10 mg total) by mouth daily., Disp: 90 tablet, Rfl: 3 .  FREESTYLE LITE test strip, , Disp: , Rfl: 3 .  hydrochlorothiazide (HYDRODIURIL) 25 MG tablet, Take 1 tablet (25 mg total) by mouth daily., Disp: 90 tablet, Rfl: 3 .  Lancets (FREESTYLE)  lancets, , Disp: , Rfl: 3 .  levothyroxine (SYNTHROID) 125 MCG tablet, Take 1 tablet (125 mcg total) by mouth as directed. 8 tablets weekly, Disp: 98 tablet, Rfl: 2 .  metFORMIN (GLUCOPHAGE) 500 MG tablet, Take 1 tablet (500 mg total) by mouth 2 (two) times daily with a meal., Disp: 180 tablet, Rfl: 3 .  omeprazole (PRILOSEC) 40 MG capsule, Take 1 capsule (40 mg total) by mouth daily., Disp: 90 capsule, Rfl: 3 .  traZODone (DESYREL) 100 MG tablet, Take 1 tablet (100 mg total) by mouth at bedtime., Disp: 90 tablet, Rfl: 3 .  venlafaxine XR (EFFEXOR-XR) 75 MG 24 hr capsule, Take 1 capsule (75 mg total) by mouth daily with breakfast., Disp: 90 capsule, Rfl: 3  EXAM:  VITALS per patient if applicable:  GENERAL: alert, oriented, appears well and in no acute distress  HEENT: atraumatic, conjunttiva clear, no obvious abnormalities on inspection of external nose and ears  NECK: normal movements of the head and neck  LUNGS: on inspection no signs of respiratory distress, breathing rate appears normal, no obvious gross SOB, gasping or wheezing  CV: no obvious cyanosis  MS: moves all visible extremities without noticeable abnormality  PSYCH/NEURO: pleasant and cooperative, no obvious depression or anxiety, speech and thought processing grossly intact  ASSESSMENT AND PLAN:  Discussed the following assessment and plan:  Multiple lung nodules with h/o smoking and thyroid cancer - Plan: CT Chest Wo Contrast rec smoking cessation  Of not h/o thyroid cancer pt considering going back to [facility] endocrine and will call for appt   Anxiety - Plan: ALPRAZolam (XANAX) 1 MG tablet qd prn   Lymphadenopathy of head and neck right cervical and left supraclavicular - Plan: CT Chest Wo Contrast, CBC with Differential/Platelet If CT chest not concerning consider GI referral for EGD    Fatty liver - Plan: Comprehensive metabolic panel, hep A  Given info  Control risk factors   Type 2 diabetes mellitus  without complication, without long-term current use of insulin ([place]) - Plan: Comprehensive metabolic panel, Lipid panel, Hemoglobin A1c Cont meds   HM Flu shothad 08/09/19 Tdap had 12/08/15  pna 23 utd  Hep A/B vx hadhep B immune Check Hep A titer  Disc shingrix vaccinehold vaccinept wants to think about itin the past Per pt check MMR and immune 2/2 pfizer covid had   Due colonoscopy h/o polypsnoted 09/18/15 pt wants repeat due to [place] mGM colon cancer  -09/15/18 normal repeat in 5  years with FH Consider EGD with left supraclavicular lymph node pending CT chest   Pap at f/u h/o abnormal pap and LEEP -08/25/18 negative negative HPV  Mammogram2/8/21 normal  Smoker 1/2 ppd rec cessation rec cessation   CT chest for lung nodules 2021   -we discussed possible serious and likely etiologies, options for evaluation and workup, limitations of telemedicine visit vs in person visit, treatment, treatment risks and precautions. Pt prefers to treat via telemedicine empirically rather then risking or undertaking an in person visit at this moment. Patient agrees to seek prompt in person care if worsening, new symptoms arise, or if is not improving with treatment.   I discussed the assessment and treatment plan with the patient. The patient was provided an opportunity to ask questions and all were answered. The patient agreed with the plan and demonstrated an understanding of the instructions.   The patient was advised to call back or seek an in-person evaluation if the symptoms worsen or if the condition fails to improve as anticipated.  Time 20 min Delorise Jackson, MD

## 2020-03-17 NOTE — Patient Instructions (Signed)
Pulmonary Nodule A pulmonary nodule is a small, round growth of tissue in the lung. It is sometimes referred to as a "shadow" or "spot on the lung." Nodules range in size from less than 1/5 of an inch (4 mm) to a little bigger than an inch (30 mm). Pulmonary nodules can be either noncancerous (benign) or cancerous (malignant). Most are noncancerous. Smaller nodules in people who do not smoke and do not have any other risk factors for lung cancer are more likely to be noncancerous. Larger, irregular nodules in people who smoke or who have a strong family history of lung cancer are more likely to be cancerous. What are the causes? This condition may be caused by:  A bacterial, fungal, or viral infection, such as tuberculosis. The infection is usually an old and inactive one.  A noncancerous mass of tissue.  Inflammation from conditions such as rheumatoid arthritis.  Abnormal blood vessels in the lungs.  Cancerous tissue, such as lung cancer or a cancer in another part of the body that has spread to the lung. What are the signs or symptoms? This condition usually does not cause symptoms. If symptoms appear, they are usually related to the underlying cause. For example, if the condition is caused by an infection, you may have a cough or fever. How is this diagnosed? This condition is usually diagnosed with an X-ray or CT scan. To help determine whether a pulmonary nodule is benign or malignant, your health care provider will:  Take your medical history.  Perform a physical exam.  Order tests, including: ? Blood tests. ? A skin test called a tuberculin test. This test is done to check if you have been exposed to the germ that causes tuberculosis. ? Chest X-rays. ? A CT scan. This test shows smaller pulmonary nodules more clearly and with more detail than an X-ray. ? A positron emission tomography (PET) scan. This test is done to check if the nodule is cancerous. During the test, a safe amount  of a radioactive substance is injected into the bloodstream. Then a picture is taken. ? Biopsy. In this test, a tiny piece of the pulmonary nodule is removed and then examined under a microscope. How is this treated? Treatment for this condition depends on whether the pulmonary nodule is malignant or benign as well as your risk of getting cancer.  Noncancerous nodules usually do not need to be treated, but they may need to be monitored with CT scans. If a CT scan shows that the pulmonary nodule got bigger, more tests may be done.  Some nodules need to be removed. If this is the case, you may have a procedure called a thoractomy. During the procedure, your health care provider will make an incision in your chest and remove the part of the lung where the nodule is located. Follow these instructions at home:   Take over-the-counter and prescription medicines only as told by your health care provider.  Do not use any products that contain nicotine or tobacco, such as cigarettes and e-cigarettes. If you need help quitting, ask your health care provider.  Keep all follow-up visits as told by your health care provider. This is important. Contact a health care provider if:  You have trouble breathing when you are active.  You feel sick or unusually tired.  You do not feel like eating.  You lose weight without trying.  You develop chills or night sweats. Get help right away if:  You cannot catch your breath.    You begin wheezing.  You cannot stop coughing.  You cough up blood.  You become dizzy or feel like you are going to faint.  You have sudden chest pain.  You have a fever or persistent symptoms for more than 2-3 days.  You have a fever and your symptoms suddenly get worse. Summary  A pulmonary nodule is a small, round growth of tissue in the lung. Most pulmonary nodules are noncancerous.  This condition is usually diagnosed with an X-ray or CT scan.  Common causes of  pulmonary nodules include infection, inflammation, and noncancerous growths.  Though less common, if a nodule is found to be cancerous, you will need specific diagnostic tests and treatment options as directed by your medical provider.  Treatment for this condition depends on whether the pulmonary nodule is benign or malignant as well as your risk of getting cancer. This information is not intended to replace advice given to you by your health care provider. Make sure you discuss any questions you have with your health care provider. Document Revised: 11/28/2017 Document Reviewed: 12/03/2016 Elsevier Patient Education  2020 [place].  Nonalcoholic Fatty Liver Disease Diet, Adult Nonalcoholic fatty liver disease is a condition that causes fat to build up in and around the liver. The disease makes it harder for the liver to work the way that it should. Following a healthy diet can help to keep nonalcoholic fatty liver disease under control. It can also help to prevent or improve conditions that are associated with the disease, such as heart disease, diabetes, high blood pressure, and abnormal cholesterol levels. Along with regular exercise, this diet:  Promotes weight loss.  Helps to control blood sugar levels.  Helps to improve the way that the body uses insulin. What are tips for following this plan? Reading food labels Always check food labels for:  The amount of saturated fat in a food. You should limit your intake of saturated fat. Saturated fat is found in foods that come from animals, including meat and dairy products such as butter, cheese, and whole milk.  The amount of fiber in a food. You should choose high-fiber foods such as fruits, vegetables, and whole grains. Try to get 25-30 grams (g) of fiber a day.  Cooking  When cooking, use heart-healthy oils that are high in monounsaturated fats. These include olive oil, canola oil, and avocado oil.  Limit frying or deep-frying  foods. Cook foods using healthy methods such as baking, boiling, steaming, and grilling instead. Meal planning  You may want to keep track of how many calories you take in. Eating the right amount of calories will help you achieve a healthy weight. Meeting with a registered dietitian can help you get started.  Limit how often you eat takeout and fast food. These foods are usually very high in fat, salt, and sugar.  Use the glycemic index (GI) to plan your meals. The index tells you how quickly a food will raise your blood sugar. Choose low-GI foods (GI less than 55). These foods take a longer time to raise blood sugar. A registered dietitian can help you identify foods lower on the GI scale. Lifestyle  You may want to follow a Mediterranean diet. This diet includes a lot of vegetables, lean meats or fish, whole grains, fruits, and healthy oils and fats. What foods can I eat?  Fruits Bananas. Apples. Oranges. Grapes. Papaya. Mango. Pomegranate. Kiwi. Grapefruit. Cherries. Vegetables Lettuce. Spinach. Peas. Beets. Cauliflower. Cabbage. Broccoli. Carrots. Tomatoes. Squash. Eggplant. Herbs.  Peppers. Onions. Cucumbers. Brussels sprouts. Yams and sweet potatoes. Beans. Lentils. Grains Whole wheat or whole-grain foods, including breads, crackers, cereals, and pasta. Stone-ground whole wheat. Unsweetened oatmeal. Bulgur. Barley. Quinoa. Brown or wild rice. Corn or whole wheat flour tortillas. Meats and other proteins Lean meats. Poultry. Tofu. Seafood and shellfish. Dairy Low-fat or fat-free dairy products, such as yogurt, cottage cheese, or cheese. Beverages Water. Sugar-free drinks. Tea. Coffee. Low-fat or skim milk. Milk alternatives, such as soy or almond milk. Real fruit juice. Fats and oils Avocado. Canola or olive oil. Nuts and nut butters. Seeds. Seasonings and condiments Mustard. Relish. Low-fat, low-sugar ketchup and barbecue sauce. Low-fat or fat-free mayonnaise. Sweets and  desserts Sugar-free sweets. The items listed above may not be a complete list of foods and beverages you can eat. Contact a dietitian for more information. What foods should I limit or avoid? Meats and other proteins Limit red meat to 1-2 times a week. Dairy [employer]. Fats and oils Palm oil and coconut oil. Fried foods. Other foods Processed foods. Foods that contain a lot of salt or sodium. Sweets and desserts Sweets that contain sugar. Beverages Sweetened drinks, such as sweet tea, milkshakes, iced sweet drinks, and sodas. Alcohol. The items listed above may not be a complete list of foods and beverages you should avoid. Contact a dietitian for more information. Where to find more information The [employer] of Diabetes and Digestive and Kidney Diseases: [employer] Summary  Nonalcoholic fatty liver disease is a condition that causes fat to build up in and around the liver.  Following a healthy diet can help to keep nonalcoholic fatty liver disease under control. Your diet should be rich in fruits, vegetables, whole grains, and lean proteins.  Limit your intake of saturated fat. Saturated fat is found in foods that come from animals, including meat and dairy products such as butter, cheese, and whole milk.  This diet promotes weight loss, helps to control blood sugar levels, and helps to improve the way that the body uses insulin. This information is not intended to replace advice given to you by your health care provider. Make sure you discuss any questions you have with your health care provider. Document Revised: 02/26/2019 Document Reviewed: 11/26/2018 Elsevier Patient Education  [place].  Fatty Liver Disease  Fatty liver disease occurs when too much fat has built up in your liver cells. Fatty liver disease is also called hepatic steatosis or steatohepatitis. The liver removes harmful substances from your bloodstream and produces fluids that your body  needs. It also helps your body use and store energy from the food you eat. In many cases, fatty liver disease does not cause symptoms or problems. It is often diagnosed when tests are being done for other reasons. However, over time, fatty liver can cause inflammation that may lead to more serious liver problems, such as scarring of the liver (cirrhosis) and liver failure. Fatty liver is associated with insulin resistance, increased body fat, high blood pressure (hypertension), and high cholesterol. These are features of metabolic syndrome and increase your risk for stroke, diabetes, and heart disease. What are the causes? This condition may be caused by:  Drinking too much alcohol.  Poor nutrition.  Obesity.  Cushing's syndrome.  Diabetes.  High cholesterol.  Certain drugs.  Poisons.  Some viral infections.  Pregnancy. What increases the risk? You are more likely to develop this condition if you:  Abuse alcohol.  Are overweight.  Have diabetes.  Have hepatitis.  Have  a high triglyceride level.  Are pregnant. What are the signs or symptoms? Fatty liver disease often does not cause symptoms. If symptoms do develop, they can include:  Fatigue.  Weakness.  Weight loss.  Confusion.  Abdominal pain.  Nausea and vomiting.  Yellowing of your skin and the white parts of your eyes (jaundice).  Itchy skin. How is this diagnosed? This condition may be diagnosed by:  A physical exam and medical history.  Blood tests.  Imaging tests, such as an ultrasound, CT scan, or MRI.  A liver biopsy. A small sample of liver tissue is removed using a needle. The sample is then looked at under a microscope. How is this treated? Fatty liver disease is often caused by other health conditions. Treatment for fatty liver may involve medicines and lifestyle changes to manage conditions such as:  Alcoholism.  High cholesterol.  Diabetes.  Being overweight or obese. Follow  these instructions at home:   Do not drink alcohol. If you have trouble quitting, ask your health care provider how to safely quit with the help of medicine or a supervised program. This is important to keep your condition from getting worse.  Eat a healthy diet as told by your health care provider. Ask your health care provider about working with a diet and nutrition specialist (dietitian) to develop an eating plan.  Exercise regularly. This can help you lose weight and control your cholesterol and diabetes. Talk to your health care provider about an exercise plan and which activities are best for you.  Take over-the-counter and prescription medicines only as told by your health care provider.  Keep all follow-up visits as told by your health care provider. This is important. Contact a health care provider if: You have trouble controlling your:  Blood sugar. This is especially important if you have diabetes.  Cholesterol.  Drinking of alcohol. Get help right away if:  You have abdominal pain.  You have jaundice.  You have nausea and vomiting.  You vomit blood or material that looks like coffee grounds.  You have stools that are black, tar-like, or bloody. Summary  Fatty liver disease develops when too much fat builds up in the cells of your liver.  Fatty liver disease often causes no symptoms or problems. However, over time, fatty liver can cause inflammation that may lead to more serious liver problems, such as scarring of the liver (cirrhosis).  You are more likely to develop this condition if you abuse alcohol, are pregnant, are overweight, have diabetes, have hepatitis, or have high triglyceride levels.  Contact your health care provider if you have trouble controlling your weight, blood sugar, cholesterol, or drinking of alcohol. This information is not intended to replace advice given to you by your health care provider. Make sure you discuss any questions you have with  your health care provider. Document Revised: 10/17/2017 Document Reviewed: 08/13/2017 Elsevier Patient Education  2020 [employer].

## 2020-03-21 ENCOUNTER — Telehealth: Payer: Self-pay | Admitting: Internal Medicine

## 2020-03-21 NOTE — Telephone Encounter (Signed)
Noted  

## 2020-03-21 NOTE — Telephone Encounter (Signed)
Rejection Reason - Patient Declined" Melvern said about 1 hour ago

## 2020-04-05 ENCOUNTER — Telehealth: Payer: Self-pay | Admitting: Internal Medicine

## 2020-04-05 NOTE — Telephone Encounter (Signed)
Left vm about pt about appt time date and location. 04/18/2020 arrive at 10:45 am at the outpatient imaging center.

## 2020-04-06 ENCOUNTER — Other Ambulatory Visit: Payer: Self-pay | Admitting: Internal Medicine

## 2020-04-07 ENCOUNTER — Other Ambulatory Visit: Payer: Self-pay | Admitting: Internal Medicine

## 2020-04-18 ENCOUNTER — Ambulatory Visit
Admission: RE | Admit: 2020-04-18 | Discharge: 2020-04-18 | Disposition: A | Discharge status: Home / Self Care | Payer: No Typology Code available for payment source | Source: Ambulatory Visit | Attending: Internal Medicine | Admitting: Internal Medicine

## 2020-04-18 ENCOUNTER — Other Ambulatory Visit: Payer: Self-pay

## 2020-04-18 DIAGNOSIS — Z720 Tobacco use: Secondary | ICD-10-CM | POA: Insufficient documentation

## 2020-04-18 DIAGNOSIS — R918 Other nonspecific abnormal finding of lung field: Secondary | ICD-10-CM | POA: Diagnosis not present

## 2020-04-18 DIAGNOSIS — R591 Generalized enlarged lymph nodes: Secondary | ICD-10-CM | POA: Diagnosis present

## 2020-04-18 DIAGNOSIS — Z85850 Personal history of malignant neoplasm of thyroid: Secondary | ICD-10-CM | POA: Insufficient documentation

## 2020-04-20 ENCOUNTER — Telehealth: Payer: Self-pay | Admitting: Internal Medicine

## 2020-05-29 ENCOUNTER — Other Ambulatory Visit: Payer: Self-pay | Admitting: Internal Medicine

## 2020-05-29 DIAGNOSIS — E785 Hyperlipidemia, unspecified: Secondary | ICD-10-CM

## 2020-05-29 DIAGNOSIS — K219 Gastro-esophageal reflux disease without esophagitis: Secondary | ICD-10-CM

## 2020-05-29 DIAGNOSIS — G4700 Insomnia, unspecified: Secondary | ICD-10-CM

## 2020-05-29 DIAGNOSIS — F32A Depression, unspecified: Secondary | ICD-10-CM

## 2020-05-29 DIAGNOSIS — F419 Anxiety disorder, unspecified: Secondary | ICD-10-CM

## 2020-05-29 DIAGNOSIS — R232 Flushing: Secondary | ICD-10-CM

## 2020-05-29 MED ORDER — OMEPRAZOLE 40 MG PO CPDR: 40.0000 mg | DELAYED_RELEASE_CAPSULE | Freq: Every day | ORAL | 3 refills | Status: DC

## 2020-05-29 MED ORDER — ATORVASTATIN CALCIUM 10 MG PO TABS: 10.0000 mg | ORAL_TABLET | Freq: Every day | ORAL | 3 refills | Status: DC

## 2020-05-29 MED ORDER — TRAZODONE HCL 100 MG PO TABS: 100.0000 mg | ORAL_TABLET | Freq: Every day | ORAL | 3 refills | Status: DC

## 2020-05-29 MED ORDER — VENLAFAXINE HCL ER 75 MG PO CP24: 75.0000 mg | ORAL_CAPSULE | Freq: Every day | ORAL | 3 refills | Status: DC

## 2020-07-31 ENCOUNTER — Other Ambulatory Visit: Payer: Self-pay | Admitting: Internal Medicine

## 2020-08-01 ENCOUNTER — Other Ambulatory Visit: Payer: Self-pay | Admitting: Internal Medicine

## 2020-08-01 NOTE — Telephone Encounter (Signed)
Forwarding to Dr. Maretta Bees

## 2020-08-01 NOTE — Telephone Encounter (Signed)
Patient requests RX for Trulicity. LOV 12/27/19. Patient advised to follow up in 3 months. To date, no follow up has been scheduled. Please advise.

## 2020-08-01 NOTE — Telephone Encounter (Signed)
Please advise 

## 2020-09-29 ENCOUNTER — Other Ambulatory Visit: Payer: Self-pay

## 2020-09-29 DIAGNOSIS — F419 Anxiety disorder, unspecified: Secondary | ICD-10-CM

## 2020-10-02 ENCOUNTER — Other Ambulatory Visit: Payer: Self-pay | Admitting: Internal Medicine

## 2020-10-02 MED ORDER — ALPRAZOLAM 1 MG PO TABS: 1.0000 mg | ORAL_TABLET | Freq: Every day | ORAL | 5 refills | Status: DC | PRN

## 2021-01-22 ENCOUNTER — Other Ambulatory Visit: Payer: Self-pay | Admitting: Internal Medicine

## 2021-01-22 DIAGNOSIS — I10 Essential (primary) hypertension: Secondary | ICD-10-CM

## 2021-02-13 ENCOUNTER — Telehealth: Payer: Self-pay

## 2021-02-13 NOTE — Telephone Encounter (Signed)
Please advise, does Patient need sooner appointment for orders to be placed?

## 2021-02-13 NOTE — Telephone Encounter (Signed)
Pt came in and made an appt for 02/23/21 for a follow up with Dr Kit Carson. She stated that she needs an ultrasound on her lungs. She wants that referral sent to [facility] in [place], [place]. Pt states that you can leave detailed message on her vm if you can not reach her.

## 2021-02-13 NOTE — Addendum Note (Signed)
Addended by: Orland Mustard on: 02/13/2021 05:52 PM   Modules accepted: Orders

## 2021-02-13 NOTE — Telephone Encounter (Signed)
CT scan chest/lungs  please [facility] in [place]  due 04/18/21  Inform pt when due placed order

## 2021-02-14 ENCOUNTER — Telehealth: Payer: Self-pay | Admitting: Internal Medicine

## 2021-02-14 NOTE — Telephone Encounter (Signed)
lft vm for pt to call ofc to verify ins. thanks

## 2021-02-15 ENCOUNTER — Telehealth: Payer: Self-pay | Admitting: Internal Medicine

## 2021-02-15 NOTE — Telephone Encounter (Signed)
Left message to return call 

## 2021-02-15 NOTE — Telephone Encounter (Signed)
Please inform the Patient that the CT has been ordered. We need to run this through her insurance and someone will call her to get this scheduled.

## 2021-02-15 NOTE — Telephone Encounter (Signed)
lft vm for pt to call ofc to verify ins. thanks

## 2021-02-17 ENCOUNTER — Other Ambulatory Visit: Payer: Self-pay

## 2021-02-17 MED FILL — Atorvastatin Calcium Tab 10 MG (Base Equivalent): ORAL | 30 days supply | Qty: 30 | Fill #0 | Status: AC

## 2021-02-17 MED FILL — Venlafaxine HCl Cap ER 24HR 75 MG (Base Equivalent): ORAL | 30 days supply | Qty: 30 | Fill #0 | Status: AC

## 2021-02-17 MED FILL — Venlafaxine HCl Cap ER 24HR 75 MG (Base Equivalent): ORAL | 90 days supply | Qty: 90 | Fill #0 | Status: CN

## 2021-02-17 MED FILL — Trazodone HCl Tab 100 MG: ORAL | 90 days supply | Qty: 90 | Fill #0 | Status: CN

## 2021-02-17 MED FILL — Atorvastatin Calcium Tab 10 MG (Base Equivalent): ORAL | 90 days supply | Qty: 90 | Fill #0 | Status: CN

## 2021-02-17 MED FILL — Trazodone HCl Tab 100 MG: ORAL | 30 days supply | Qty: 30 | Fill #0 | Status: AC

## 2021-02-17 MED FILL — Cyclobenzaprine HCl Tab 5 MG: ORAL | 30 days supply | Qty: 30 | Fill #0 | Status: AC

## 2021-02-19 ENCOUNTER — Telehealth: Payer: Self-pay | Admitting: Internal Medicine

## 2021-02-19 NOTE — Telephone Encounter (Signed)
Ok, Thank you! 

## 2021-02-19 NOTE — Telephone Encounter (Signed)
Patient returned office phone call. She states that she still has aetna, but she can not find the card. While waiting for patient to find card, aetna was ran and verified by Epic.

## 2021-02-23 ENCOUNTER — Ambulatory Visit: Payer: 59 | Admitting: Internal Medicine

## 2021-02-23 ENCOUNTER — Other Ambulatory Visit: Payer: Self-pay

## 2021-02-23 ENCOUNTER — Encounter: Payer: Self-pay | Admitting: Internal Medicine

## 2021-02-23 VITALS — BP 118/78 | HR 86 | Temp 97.9°F | Ht 69.0 in | Wt 230.2 lb

## 2021-02-23 DIAGNOSIS — Z1231 Encounter for screening mammogram for malignant neoplasm of breast: Secondary | ICD-10-CM

## 2021-02-23 DIAGNOSIS — F32A Depression, unspecified: Secondary | ICD-10-CM

## 2021-02-23 DIAGNOSIS — I10 Essential (primary) hypertension: Secondary | ICD-10-CM

## 2021-02-23 DIAGNOSIS — E890 Postprocedural hypothyroidism: Secondary | ICD-10-CM | POA: Diagnosis not present

## 2021-02-23 DIAGNOSIS — E1159 Type 2 diabetes mellitus with other circulatory complications: Secondary | ICD-10-CM | POA: Diagnosis not present

## 2021-02-23 DIAGNOSIS — G8929 Other chronic pain: Secondary | ICD-10-CM | POA: Insufficient documentation

## 2021-02-23 DIAGNOSIS — I152 Hypertension secondary to endocrine disorders: Secondary | ICD-10-CM

## 2021-02-23 DIAGNOSIS — R911 Solitary pulmonary nodule: Secondary | ICD-10-CM

## 2021-02-23 DIAGNOSIS — E785 Hyperlipidemia, unspecified: Secondary | ICD-10-CM

## 2021-02-23 DIAGNOSIS — R232 Flushing: Secondary | ICD-10-CM

## 2021-02-23 DIAGNOSIS — C73 Malignant neoplasm of thyroid gland: Secondary | ICD-10-CM | POA: Diagnosis not present

## 2021-02-23 DIAGNOSIS — Z0000 Encounter for general adult medical examination without abnormal findings: Secondary | ICD-10-CM | POA: Insufficient documentation

## 2021-02-23 DIAGNOSIS — R0982 Postnasal drip: Secondary | ICD-10-CM

## 2021-02-23 DIAGNOSIS — J309 Allergic rhinitis, unspecified: Secondary | ICD-10-CM

## 2021-02-23 DIAGNOSIS — E538 Deficiency of other specified B group vitamins: Secondary | ICD-10-CM

## 2021-02-23 DIAGNOSIS — G4700 Insomnia, unspecified: Secondary | ICD-10-CM

## 2021-02-23 DIAGNOSIS — F419 Anxiety disorder, unspecified: Secondary | ICD-10-CM

## 2021-02-23 DIAGNOSIS — M542 Cervicalgia: Secondary | ICD-10-CM

## 2021-02-23 DIAGNOSIS — K219 Gastro-esophageal reflux disease without esophagitis: Secondary | ICD-10-CM

## 2021-02-23 DIAGNOSIS — J40 Bronchitis, not specified as acute or chronic: Secondary | ICD-10-CM

## 2021-02-23 DIAGNOSIS — M549 Dorsalgia, unspecified: Secondary | ICD-10-CM

## 2021-02-23 LAB — COMPREHENSIVE METABOLIC PANEL
ALT: 39 U/L — ABNORMAL HIGH (ref 0–35)
AST: 17 U/L (ref 0–37)
Albumin: 4.1 g/dL (ref 3.5–5.2)
Alkaline Phosphatase: 63 U/L (ref 39–117)
BUN: 13 mg/dL (ref 6–23)
CO2: 28 mEq/L (ref 19–32)
Calcium: 8.9 mg/dL (ref 8.4–10.5)
Chloride: 105 mEq/L (ref 96–112)
Creatinine, Ser: 0.88 mg/dL (ref 0.40–1.20)
GFR: 74.76 mL/min (ref >60.00)
Glucose, Bld: 134 mg/dL — ABNORMAL HIGH (ref 70–99)
Potassium: 3.8 mEq/L (ref 3.5–5.1)
Sodium: 140 mEq/L (ref 135–145)
Total Bilirubin: 0.3 mg/dL (ref 0.2–1.2)
Total Protein: 6.5 g/dL (ref 6.0–8.3)

## 2021-02-23 LAB — LIPID PANEL
Cholesterol: 135 mg/dL (ref 0–200)
HDL: 50.3 mg/dL (ref >39.00)
LDL Cholesterol: 64 mg/dL (ref 0–99)
NonHDL: 84.62
Total CHOL/HDL Ratio: 3
Triglycerides: 102 mg/dL (ref 0.0–149.0)
VLDL: 20.4 mg/dL (ref 0.0–40.0)

## 2021-02-23 LAB — CBC WITH DIFFERENTIAL/PLATELET
Basophils Absolute: 0 10*3/uL (ref 0.0–0.1)
Basophils Relative: 0.5 % (ref 0.0–3.0)
Eosinophils Absolute: 0.1 10*3/uL (ref 0.0–0.7)
Eosinophils Relative: 1.6 % (ref 0.0–5.0)
HCT: 36.8 % (ref 36.0–46.0)
Hemoglobin: 12.2 g/dL (ref 12.0–15.0)
Lymphocytes Relative: 34.9 % (ref 12.0–46.0)
Lymphs Abs: 2.2 10*3/uL (ref 0.7–4.0)
MCHC: 33.1 g/dL (ref 30.0–36.0)
MCV: 85.4 fl (ref 78.0–100.0)
Monocytes Absolute: 0.3 10*3/uL (ref 0.1–1.0)
Monocytes Relative: 4.2 % (ref 3.0–12.0)
Neutro Abs: 3.7 10*3/uL (ref 1.4–7.7)
Neutrophils Relative %: 58.8 % (ref 43.0–77.0)
Platelets: 313 10*3/uL (ref 150.0–400.0)
RBC: 4.31 Mil/uL (ref 3.87–5.11)
RDW: 14.4 % (ref 11.5–15.5)
WBC: 6.3 10*3/uL (ref 4.0–10.5)

## 2021-02-23 LAB — T3, FREE: T3, Free: 3.1 pg/mL (ref 2.3–4.2)

## 2021-02-23 LAB — TSH: TSH: 1.03 u[IU]/mL (ref 0.35–4.50)

## 2021-02-23 LAB — HEMOGLOBIN A1C: Hgb A1c MFr Bld: 10.3 % — ABNORMAL HIGH (ref 4.6–6.5)

## 2021-02-23 LAB — T4, FREE: Free T4: 0.9 ng/dL (ref 0.60–1.60)

## 2021-02-23 LAB — VITAMIN B12: Vitamin B-12: 492 pg/mL (ref 211–911)

## 2021-02-23 MED ORDER — ATORVASTATIN CALCIUM 10 MG PO TABS
ORAL_TABLET | ORAL | 3 refills | Status: DC
  Filled 2021-02-23: qty 90, fill #0
  Filled 2021-05-09: qty 30, 30d supply, fill #0
  Filled 2021-06-14: qty 30, 30d supply, fill #1
  Filled 2021-08-10: qty 30, 30d supply, fill #2
  Filled 2021-09-06: qty 30, 30d supply, fill #3
  Filled 2021-10-03: qty 30, 30d supply, fill #4
  Filled 2021-12-24: qty 30, 30d supply, fill #5

## 2021-02-23 MED ORDER — LEVOTHYROXINE SODIUM 125 MCG PO TABS
ORAL_TABLET | ORAL | 1 refills | Status: DC
  Filled 2021-02-23: qty 60, 30d supply, fill #0
  Filled 2021-09-06: qty 60, 30d supply, fill #1
  Filled 2021-10-03: qty 60, 30d supply, fill #2

## 2021-02-23 MED ORDER — HYDROCHLOROTHIAZIDE 25 MG PO TABS
ORAL_TABLET | Freq: Every day | ORAL | 3 refills | Status: DC
  Filled 2021-02-23: qty 30, 30d supply, fill #0

## 2021-02-23 MED ORDER — VENLAFAXINE HCL ER 37.5 MG PO CP24
37.5000 mg | ORAL_CAPSULE | Freq: Every day | ORAL | 0 refills | Status: DC
  Filled 2021-02-23: qty 30, 30d supply, fill #0

## 2021-02-23 MED ORDER — IPRATROPIUM BROMIDE 0.06 % NA SOLN
2.0000 | Freq: Three times a day (TID) | NASAL | 12 refills | Status: DC
  Filled 2021-02-23: qty 15, 25d supply, fill #0

## 2021-02-23 MED ORDER — ALBUTEROL SULFATE HFA 108 (90 BASE) MCG/ACT IN AERS
INHALATION_SPRAY | RESPIRATORY_TRACT | 11 refills | Status: DC
  Filled 2021-02-23: qty 18, 30d supply, fill #0

## 2021-02-23 MED ORDER — OMEPRAZOLE 40 MG PO CPDR
DELAYED_RELEASE_CAPSULE | Freq: Every day | ORAL | 3 refills | Status: DC
  Filled 2021-02-23: qty 30, 30d supply, fill #0

## 2021-02-23 MED ORDER — DULAGLUTIDE 1.5 MG/0.5ML ~~LOC~~ SOAJ
1.5000 mg | SUBCUTANEOUS | 1 refills | Status: DC
  Filled 2021-02-23: qty 2, 28d supply, fill #0

## 2021-02-23 MED ORDER — LEVOCETIRIZINE DIHYDROCHLORIDE 5 MG PO TABS
5.0000 mg | ORAL_TABLET | Freq: Every day | ORAL | 3 refills | Status: DC | PRN
  Filled 2021-02-23: qty 30, 30d supply, fill #0

## 2021-02-23 MED ORDER — CYCLOBENZAPRINE HCL 5 MG PO TABS
ORAL_TABLET | ORAL | 11 refills | Status: DC
  Filled 2021-02-23: qty 30, fill #0

## 2021-02-23 MED ORDER — HYDROCHLOROTHIAZIDE 25 MG PO TABS
12.5000 mg | ORAL_TABLET | Freq: Every day | ORAL | 3 refills | Status: DC
  Filled 2021-02-23 – 2021-03-30 (×2): qty 15, 30d supply, fill #0
  Filled 2021-04-19: qty 15, 30d supply, fill #1
  Filled 2021-05-09: qty 15, 30d supply, fill #2
  Filled 2021-06-14: qty 15, 30d supply, fill #3
  Filled 2021-07-16: qty 15, 30d supply, fill #4
  Filled 2021-08-10: qty 15, 30d supply, fill #5
  Filled 2021-09-06: qty 15, 30d supply, fill #6
  Filled 2021-10-03: qty 15, 30d supply, fill #7
  Filled 2021-11-13: qty 15, 30d supply, fill #8
  Filled 2021-12-24: qty 15, 30d supply, fill #9

## 2021-02-23 MED ORDER — TRAZODONE HCL 100 MG PO TABS
ORAL_TABLET | Freq: Every day | ORAL | 3 refills | Status: DC
  Filled 2021-02-23: qty 30, 30d supply, fill #0

## 2021-02-23 MED ORDER — ALPRAZOLAM 1 MG PO TABS
ORAL_TABLET | Freq: Every day | ORAL | 5 refills | Status: AC | PRN
  Filled 2021-02-23 – 2021-03-30 (×2): qty 30, 30d supply, fill #0
  Filled 2021-05-09: qty 30, 30d supply, fill #1
  Filled 2021-06-14: qty 30, 30d supply, fill #2
  Filled 2021-07-25: qty 30, 30d supply, fill #3

## 2021-02-23 NOTE — Patient Instructions (Addendum)
Lidocaine/salonpas pain patches  Heat Exercising  Consider back injections  Dr. Sharlet Salina or [facility] pain clinic    Mammogram call now  Lungs CT chest due 04/18/21   Low Back Sprain or Strain Rehab Ask your health care provider which exercises are safe for you. Do exercises exactly as told by your health care provider and adjust them as directed. It is normal to feel mild stretching, pulling, tightness, or discomfort as you do these exercises. Stop right away if you feel sudden pain or your pain gets worse. Do not begin these exercises until told by your health care provider. Stretching and range-of-motion exercises These exercises warm up your muscles and joints and improve the movement and flexibility of your back. These exercises also help to relieve pain, numbness, and tingling. Lumbar rotation 1. Lie on your back on a firm surface and bend your knees. 2. Straighten your arms out to your sides so each arm forms a 90-degree angle (right angle) with a side of your body. 3. Slowly move (rotate) both of your knees to one side of your body until you feel a stretch in your lower back (lumbar). Try not to let your shoulders lift off the floor. 4. Hold this position for __________ seconds. 5. Tense your abdominal muscles and slowly move your knees back to the starting position. 6. Repeat this exercise on the other side of your body. Repeat __________ times. Complete this exercise __________ times a day.   Single knee to chest 1. Lie on your back on a firm surface with both legs straight. 2. Bend one of your knees. Use your hands to move your knee up toward your chest until you feel a gentle stretch in your lower back and buttock. ? Hold your leg in this position by holding on to the front of your knee. ? Keep your other leg as straight as possible. 3. Hold this position for __________ seconds. 4. Slowly return to the starting position. 5. Repeat with your other leg. Repeat __________ times.  Complete this exercise __________ times a day.   Prone extension on elbows 1. Lie on your abdomen on a firm surface (prone position). 2. Prop yourself up on your elbows. 3. Use your arms to help lift your chest up until you feel a gentle stretch in your abdomen and your lower back. ? This will place some of your body weight on your elbows. If this is uncomfortable, try stacking pillows under your chest. ? Your hips should stay down, against the surface that you are lying on. Keep your hip and back muscles relaxed. 4. Hold this position for __________ seconds. 5. Slowly relax your upper body and return to the starting position. Repeat __________ times. Complete this exercise __________ times a day.   Strengthening exercises These exercises build strength and endurance in your back. Endurance is the ability to use your muscles for a long time, even after they get tired. Pelvic tilt This exercise strengthens the muscles that lie deep in the abdomen. 1. Lie on your back on a firm surface. Bend your knees and keep your feet flat on the floor. 2. Tense your abdominal muscles. Tip your pelvis up toward the ceiling and flatten your lower back into the floor. ? To help with this exercise, you may place a small towel under your lower back and try to push your back into the towel. 3. Hold this position for __________ seconds. 4. Let your muscles relax completely before you repeat this exercise. Repeat  __________ times. Complete this exercise __________ times a day. Alternating arm and leg raises 1. Get on your hands and knees on a firm surface. If you are on a hard floor, you may want to use padding, such as an exercise mat, to cushion your knees. 2. Line up your arms and legs. Your hands should be directly below your shoulders, and your knees should be directly below your hips. 3. Lift your left leg behind you. At the same time, raise your right arm and straighten it in front of you. ? Do not lift your  leg higher than your hip. ? Do not lift your arm higher than your shoulder. ? Keep your abdominal and back muscles tight. ? Keep your hips facing the ground. ? Do not arch your back. ? Keep your balance carefully, and do not hold your breath. 4. Hold this position for __________ seconds. 5. Slowly return to the starting position. 6. Repeat with your right leg and your left arm. Repeat __________ times. Complete this exercise __________ times a day.   Abdominal set with straight leg raise 1. Lie on your back on a firm surface. 2. Bend one of your knees and keep your other leg straight. 3. Tense your abdominal muscles and lift your straight leg up, 4-6 inches (10-15 cm) off the ground. 4. Keep your abdominal muscles tight and hold this position for __________ seconds. ? Do not hold your breath. ? Do not arch your back. Keep it flat against the ground. 5. Keep your abdominal muscles tense as you slowly lower your leg back to the starting position. 6. Repeat with your other leg. Repeat __________ times. Complete this exercise __________ times a day.   Single leg lower with bent knees 1. Lie on your back on a firm surface. 2. Tense your abdominal muscles and lift your feet off the floor, one foot at a time, so your knees and hips are bent in 90-degree angles (right angles). ? Your knees should be over your hips and your lower legs should be parallel to the floor. 3. Keeping your abdominal muscles tense and your knee bent, slowly lower one of your legs so your toe touches the ground. 4. Lift your leg back up to return to the starting position. ? Do not hold your breath. ? Do not let your back arch. Keep your back flat against the ground. 5. Repeat with your other leg. Repeat __________ times. Complete this exercise __________ times a day. Posture and body mechanics Good posture and healthy body mechanics can help to relieve stress in your body's tissues and joints. Body mechanics refers to  the movements and positions of your body while you do your daily activities. Posture is part of body mechanics. Good posture means:  Your spine is in its natural S-curve position (neutral).  Your shoulders are pulled back slightly.  Your head is not tipped forward. Follow these guidelines to improve your posture and body mechanics in your everyday activities. Standing  When standing, keep your spine neutral and your feet about hip width apart. Keep a slight bend in your knees. Your ears, shoulders, and hips should line up.  When you do a task in which you stand in one place for a long time, place one foot up on a stable object that is 2-4 inches (5-10 cm) high, such as a footstool. This helps keep your spine neutral.   Sitting  When sitting, keep your spine neutral and keep your feet flat on the  floor. Use a footrest, if necessary, and keep your thighs parallel to the floor. Avoid rounding your shoulders, and avoid tilting your head forward.  When working at a desk or a computer, keep your desk at a height where your hands are slightly lower than your elbows. Slide your chair under your desk so you are close enough to maintain good posture.  When working at a computer, place your monitor at a height where you are looking straight ahead and you do not have to tilt your head forward or downward to look at the screen.   Resting  When lying down and resting, avoid positions that are most painful for you.  If you have pain with activities such as sitting, bending, stooping, or squatting, lie in a position in which your body does not bend very much. For example, avoid curling up on your side with your arms and knees near your chest (fetal position).  If you have pain with activities such as standing for a long time or reaching with your arms, lie with your spine in a neutral position and bend your knees slightly. Try the following positions: ? Lying on your side with a pillow between your  knees. ? Lying on your back with a pillow under your knees. Lifting  When lifting objects, keep your feet at least shoulder width apart and tighten your abdominal muscles.  Bend your knees and hips and keep your spine neutral. It is important to lift using the strength of your legs, not your back. Do not lock your knees straight out.  Always ask for help to lift heavy or awkward objects.   This information is not intended to replace advice given to you by your health care provider. Make sure you discuss any questions you have with your health care provider. Document Revised: 02/26/2019 Document Reviewed: 11/26/2018 Elsevier Patient Education  2021 [place].  Back Exercises The following exercises strengthen the muscles that help to support the trunk and back. They also help to keep the lower back flexible. Doing these exercises can help to prevent back pain or lessen existing pain.  If you have back pain or discomfort, try doing these exercises 2-3 times each day or as told by your health care provider.  As your pain improves, do them once each day, but increase the number of times that you repeat the steps for each exercise (do more repetitions).  To prevent the recurrence of back pain, continue to do these exercises once each day or as told by your health care provider. Do exercises exactly as told by your health care provider and adjust them as directed. It is normal to feel mild stretching, pulling, tightness, or discomfort as you do these exercises, but you should stop right away if you feel sudden pain or your pain gets worse. Exercises Single knee to chest Repeat these steps 3-5 times for each leg: 1. Lie on your back on a firm bed or the floor with your legs extended. 2. Bring one knee to your chest. Your other leg should stay extended and in contact with the floor. 3. Hold your knee in place by grabbing your knee or thigh with both hands and hold. 4. Pull on your knee until  you feel a gentle stretch in your lower back or buttocks. 5. Hold the stretch for 10-30 seconds. 6. Slowly release and straighten your leg. Pelvic tilt Repeat these steps 5-10 times: 1. Lie on your back on a firm bed or the  floor with your legs extended. 2. Bend your knees so they are pointing toward the ceiling and your feet are flat on the floor. 3. Tighten your lower abdominal muscles to press your lower back against the floor. This motion will tilt your pelvis so your tailbone points up toward the ceiling instead of pointing to your feet or the floor. 4. With gentle tension and even breathing, hold this position for 5-10 seconds. Cat-cow Repeat these steps until your lower back becomes more flexible: 1. Get into a hands-and-knees position on a firm surface. Keep your hands under your shoulders, and keep your knees under your hips. You may place padding under your knees for comfort. 2. Let your head hang down toward your chest. Contract your abdominal muscles and point your tailbone toward the floor so your lower back becomes rounded like the back of a cat. 3. Hold this position for 5 seconds. 4. Slowly lift your head, let your abdominal muscles relax and point your tailbone up toward the ceiling so your back forms a sagging arch like the back of a cow. 5. Hold this position for 5 seconds.   Press-ups Repeat these steps 5-10 times: 1. Lie on your abdomen (face-down) on the floor. 2. Place your palms near your head, about shoulder-width apart. 3. Keeping your back as relaxed as possible and keeping your hips on the floor, slowly straighten your arms to raise the top half of your body and lift your shoulders. Do not use your back muscles to raise your upper torso. You may adjust the placement of your hands to make yourself more comfortable. 4. Hold this position for 5 seconds while you keep your back relaxed. 5. Slowly return to lying flat on the floor.   Bridges Repeat these steps 10  times: 1. Lie on your back on a firm surface. 2. Bend your knees so they are pointing toward the ceiling and your feet are flat on the floor. Your arms should be flat at your sides, next to your body. 3. Tighten your buttocks muscles and lift your buttocks off the floor until your waist is at almost the same height as your knees. You should feel the muscles working in your buttocks and the back of your thighs. If you do not feel these muscles, slide your feet 1-2 inches farther away from your buttocks. 4. Hold this position for 3-5 seconds. 5. Slowly lower your hips to the starting position, and allow your buttocks muscles to relax completely. If this exercise is too easy, try doing it with your arms crossed over your chest.   Abdominal crunches Repeat these steps 5-10 times: 1. Lie on your back on a firm bed or the floor with your legs extended. 2. Bend your knees so they are pointing toward the ceiling and your feet are flat on the floor. 3. Cross your arms over your chest. 4. Tip your chin slightly toward your chest without bending your neck. 5. Tighten your abdominal muscles and slowly raise your trunk (torso) high enough to lift your shoulder blades a tiny bit off the floor. Avoid raising your torso higher than that because it can put too much stress on your low back and does not help to strengthen your abdominal muscles. 6. Slowly return to your starting position. Back lifts Repeat these steps 5-10 times: 1. Lie on your abdomen (face-down) with your arms at your sides, and rest your forehead on the floor. 2. Tighten the muscles in your legs and your buttocks.  3. Slowly lift your chest off the floor while you keep your hips pressed to the floor. Keep the back of your head in line with the curve in your back. Your eyes should be looking at the floor. 4. Hold this position for 3-5 seconds. 5. Slowly return to your starting position. Contact a health care provider if:  Your back pain or  discomfort gets much worse when you do an exercise.  Your worsening back pain or discomfort does not lessen within 2 hours after you exercise. If you have any of these problems, stop doing these exercises right away. Do not do them again unless your health care provider says that you can. Get help right away if:  You develop sudden, severe back pain. If this happens, stop doing the exercises right away. Do not do them again unless your health care provider says that you can. This information is not intended to replace advice given to you by your health care provider. Make sure you discuss any questions you have with your health care provider. Document Revised: 03/11/2019 Document Reviewed: 08/06/2018 Elsevier Patient Education  2021 [place].  Sciatica Rehab Ask your health care provider which exercises are safe for you. Do exercises exactly as told by your health care provider and adjust them as directed. It is normal to feel mild stretching, pulling, tightness, or discomfort as you do these exercises. Stop right away if you feel sudden pain or your pain gets worse. Do not begin these exercises until told by your health care provider. Stretching and range-of-motion exercises These exercises warm up your muscles and joints and improve the movement and flexibility of your hips and back. These exercises also help to relieve pain, numbness, and tingling. Sciatic nerve glide 1. Sit in a chair with your head facing down toward your chest. Place your hands behind your back. Let your shoulders slump forward. 2. Slowly straighten one of your legs while you tilt your head back as if you are looking toward the ceiling. Only straighten your leg as far as you can without making your symptoms worse. 3. Hold this position for __________ seconds. 4. Slowly return to the starting position. 5. Repeat with your other leg. Repeat __________ times. Complete this exercise __________ times a day. Knee to chest  with hip adduction and internal rotation 1. Lie on your back on a firm surface with both legs straight. 2. Bend one of your knees and move it up toward your chest until you feel a gentle stretch in your lower back and buttock. Then, move your knee toward the shoulder that is on the opposite side from your leg. This is hip adduction and internal rotation. ? Hold your leg in this position by holding on to the front of your knee. 3. Hold this position for __________ seconds. 4. Slowly return to the starting position. 5. Repeat with your other leg. Repeat __________ times. Complete this exercise __________ times a day.   Prone extension on elbows 1. Lie on your abdomen on a firm surface. A bed may be too soft for this exercise. 2. Prop yourself up on your elbows. 3. Use your arms to help lift your chest up until you feel a gentle stretch in your abdomen and your lower back. ? This will place some of your body weight on your elbows. If this is uncomfortable, try stacking pillows under your chest. ? Your hips should stay down, against the surface that you are lying on. Keep your hip and  back muscles relaxed. 4. Hold this position for __________ seconds. 5. Slowly relax your upper body and return to the starting position. Repeat __________ times. Complete this exercise __________ times a day.   Strengthening exercises These exercises build strength and endurance in your back. Endurance is the ability to use your muscles for a long time, even after they get tired. Pelvic tilt This exercise strengthens the muscles that lie deep in the abdomen. 1. Lie on your back on a firm surface. Bend your knees and keep your feet flat on the floor. 2. Tense your abdominal muscles. Tip your pelvis up toward the ceiling and flatten your lower back into the floor. ? To help with this exercise, you may place a small towel under your lower back and try to push your back into the towel. 3. Hold this position for  __________ seconds. 4. Let your muscles relax completely before you repeat this exercise. Repeat __________ times. Complete this exercise __________ times a day. Alternating arm and leg raises 1. Get on your hands and knees on a firm surface. If you are on a hard floor, you may want to use padding, such as an exercise mat, to cushion your knees. 2. Line up your arms and legs. Your hands should be directly below your shoulders, and your knees should be directly below your hips. 3. Lift your left leg behind you. At the same time, raise your right arm and straighten it in front of you. ? Do not lift your leg higher than your hip. ? Do not lift your arm higher than your shoulder. ? Keep your abdominal and back muscles tight. ? Keep your hips facing the ground. ? Do not arch your back. ? Keep your balance carefully, and do not hold your breath. 4. Hold this position for __________ seconds. 5. Slowly return to the starting position. 6. Repeat with your right leg and your left arm. Repeat __________ times. Complete this exercise __________ times a day.   Posture and body mechanics Good posture and healthy body mechanics can help to relieve stress in your body's tissues and joints. Body mechanics refers to the movements and positions of your body while you do your daily activities. Posture is part of body mechanics. Good posture means:  Your spine is in its natural S-curve position (neutral).  Your shoulders are pulled back slightly.  Your head is not tipped forward. Follow these guidelines to improve your posture and body mechanics in your everyday activities. Standing  When standing, keep your spine neutral and your feet about hip width apart. Keep a slight bend in your knees. Your ears, shoulders, and hips should line up.  When you do a task in which you stand in one place for a long time, place one foot up on a stable object that is 2-4 inches (5-10 cm) high, such as a footstool. This helps  keep your spine neutral.   Sitting  When sitting, keep your spine neutral and keep your feet flat on the floor. Use a footrest, if necessary, and keep your thighs parallel to the floor. Avoid rounding your shoulders, and avoid tilting your head forward.  When working at a desk or a computer, keep your desk at a height where your hands are slightly lower than your elbows. Slide your chair under your desk so you are close enough to maintain good posture.  When working at a computer, place your monitor at a height where you are looking straight ahead and you do  not have to tilt your head forward or downward to look at the screen.   Resting  When lying down and resting, avoid positions that are most painful for you.  If you have pain with activities such as sitting, bending, stooping, or squatting, lie in a position in which your body does not bend very much. For example, avoid curling up on your side with your arms and knees near your chest (fetal position).  If you have pain with activities such as standing for a long time or reaching with your arms, lie with your spine in a neutral position and bend your knees slightly. Try the following positions: ? Lying on your side with a pillow between your knees. ? Lying on your back with a pillow under your knees. Lifting  When lifting objects, keep your feet at least shoulder width apart and tighten your abdominal muscles.  Bend your knees and hips and keep your spine neutral. It is important to lift using the strength of your legs, not your back. Do not lock your knees straight out.  Always ask for help to lift heavy or awkward objects.   This information is not intended to replace advice given to you by your health care provider. Make sure you discuss any questions you have with your health care provider. Document Revised: 02/26/2019 Document Reviewed: 11/26/2018 Elsevier Patient Education  2021 [place].  Sciatica  Sciatica is pain,  numbness, weakness, or tingling along the path of the sciatic nerve. The sciatic nerve starts in the lower back and runs down the back of each leg. The nerve controls the muscles in the lower leg and in the back of the knee. It also provides feeling (sensation) to the back of the thigh, the lower leg, and the sole of the foot. Sciatica is a symptom of another medical condition that pinches or puts pressure on the sciatic nerve. Sciatica most often only affects one side of the body. Sciatica usually goes away on its own or with treatment. In some cases, sciatica may come back (recur). What are the causes? This condition is caused by pressure on the sciatic nerve or pinching of the nerve. This may be the result of:  A disk in between the bones of the spine bulging out too far (herniated disk).  Age-related changes in the spinal disks.  A pain disorder that affects a muscle in the buttock.  Extra bone growth near the sciatic nerve.  A break (fracture) of the pelvis.  Pregnancy.  Tumor. This is rare. What increases the risk? The following factors may make you more likely to develop this condition:  Playing sports that place pressure or stress on the spine.  Having poor strength and flexibility.  A history of back injury or surgery.  Sitting for long periods of time.  Doing activities that involve repetitive bending or lifting.  Obesity. What are the signs or symptoms? Symptoms can vary from mild to very severe, and they may include:  Any of these problems in the lower back, leg, hip, or buttock: ? Mild tingling, numbness, or dull aches. ? Burning sensations. ? Sharp pains.  Numbness in the back of the calf or the sole of the foot.  Leg weakness.  Severe back pain that makes movement difficult. Symptoms may get worse when you cough, sneeze, or laugh, or when you sit or stand for long periods of time. How is this diagnosed? This condition may be diagnosed based on:  Your  symptoms and  medical history.  A physical exam.  Blood tests.  Imaging tests, such as: ? X-rays. ? MRI. ? CT scan. How is this treated? In many cases, this condition improves on its own without treatment. However, treatment may include:  Reducing or modifying physical activity.  Exercising and stretching.  Icing and applying heat to the affected area.  Medicines that help to: ? Relieve pain and swelling. ? Relax your muscles.  Injections of medicines that help to relieve pain, irritation, and inflammation around the sciatic nerve (steroids).  Surgery. Follow these instructions at home: Medicines  Take over-the-counter and prescription medicines only as told by your health care provider.  Ask your health care provider if the medicine prescribed to you: ? Requires you to avoid driving or using heavy machinery. ? Can cause constipation. You may need to take these actions to prevent or treat constipation:  Drink enough fluid to keep your urine pale yellow.  Take over-the-counter or prescription medicines.  Eat foods that are high in fiber, such as beans, whole grains, and fresh fruits and vegetables.  Limit foods that are high in fat and processed sugars, such as fried or sweet foods. Managing pain  If directed, put ice on the affected area. ? Put ice in a plastic bag. ? Place a towel between your skin and the bag. ? Leave the ice on for 20 minutes, 2-3 times a day.  If directed, apply heat to the affected area. Use the heat source that your health care provider recommends, such as a moist heat pack or a heating pad. ? Place a towel between your skin and the heat source. ? Leave the heat on for 20-30 minutes. ? Remove the heat if your skin turns bright red. This is especially important if you are unable to feel pain, heat, or cold. You may have a greater risk of getting burned.      Activity  Return to your normal activities as told by your health care provider.  Ask your health care provider what activities are safe for you.  Avoid activities that make your symptoms worse.  Take brief periods of rest throughout the day. ? When you rest for longer periods, mix in some mild activity or stretching between periods of rest. This will help to prevent stiffness and pain. ? Avoid sitting for long periods of time without moving. Get up and move around at least one time each hour.  Exercise and stretch regularly, as told by your health care provider.  Do not lift anything that is heavier than 10 lb (4.5 kg) while you have symptoms of sciatica. When you do not have symptoms, you should still avoid heavy lifting, especially repetitive heavy lifting.  When you lift objects, always use proper lifting technique, which includes: ? Bending your knees. ? Keeping the load close to your body. ? Avoiding twisting.   General instructions  Maintain a healthy weight. Excess weight puts extra stress on your back.  Wear supportive, comfortable shoes. Avoid wearing high heels.  Avoid sleeping on a mattress that is too soft or too hard. A mattress that is firm enough to support your back when you sleep may help to reduce your pain.  Keep all follow-up visits as told by your health care provider. This is important. Contact a health care provider if:  You have pain that: ? Wakes you up when you are sleeping. ? Gets worse when you lie down. ? Is worse than you have experienced in the past. ?  Lasts longer than 4 weeks.  You have an unexplained weight loss. Get help right away if:  You are not able to control when you urinate or have bowel movements (incontinence).  You have: ? Weakness in your lower back, pelvis, buttocks, or legs that gets worse. ? Redness or swelling of your back. ? A burning sensation when you urinate. Summary  Sciatica is pain, numbness, weakness, or tingling along the path of the sciatic nerve.  This condition is caused by pressure on the  sciatic nerve or pinching of the nerve.  Sciatica can cause pain, numbness, or tingling in the lower back, legs, hips, and buttocks.  Treatment often includes rest, exercise, medicines, and applying ice or heat. This information is not intended to replace advice given to you by your health care provider. Make sure you discuss any questions you have with your health care provider. Document Revised: 11/23/2018 Document Reviewed: 11/23/2018 Elsevier Patient Education  [place].

## 2021-02-23 NOTE — Telephone Encounter (Signed)
Patient was seen in office today and spoke with Dr Olivia Mackie McLean-Scocuzza, 02/23/21

## 2021-02-23 NOTE — Progress Notes (Signed)
Chief Complaint  Patient presents with  . Follow-up   Annual  1. H/o primary thyroid papillary adenocarcinoma TOC back to [facility] Dr. Ferne Coe appt sch 04/18/21 she is on levo 125 mcg M-Sat and Sunday 250 mcg  2. DM 2 uncontrolled she has been out of trulicity 1.5 weekly she stopped taking metformin 500 mg bid and does not wish to resume  -will have pt f/u with [facility] endocrine and them to decide with h/o thyroid cancer if trulcity is best option for her diabetes   On vasotec 10 mg qd hctz 25 mg qd, Fasting for albs 3. Anxiety needs refill xanax 1 mg qd prn and hot flashes on effexor xr 75 mg qd wants to taper off not helping with hot flashes 4. CT 04/18/20 +<0.36 mm lung nodules b/l needs to repeat she is a smoker   Review of Systems  Constitutional: Negative for weight loss.  HENT: Negative for hearing loss.   Eyes: Negative for blurred vision.  Respiratory: Negative for shortness of breath.   Cardiovascular: Negative for chest pain.  Gastrointestinal: Negative for abdominal pain.  Genitourinary:       +hot flashes  Musculoskeletal: Negative for falls and joint pain.  Skin: Positive for itching. Negative for rash.       Itching to neck  Neurological: Negative for headaches.  Psychiatric/Behavioral: Negative for depression. The patient is nervous/anxious.    Past Medical History:  Diagnosis Date  . Anxiety   . Asthma   . Cancer ([place])    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 saliva production   . Colon polyps   . Depression   . Diabetes mellitus without complication ([place])   . GERD (gastroesophageal reflux disease)   . Herpes    1+2  . Hyperlipidemia   . Hypertension   . OSA on CPAP   . Thyroid disease    Past Surgical History:  Procedure Laterality Date  . COLONOSCOPY WITH PROPOFOL N/A 09/15/2018   Procedure: COLONOSCOPY WITH PROPOFOL;  Surgeon: [name], MD;  Location[facility] ENDOSCOPY;  Service: Endoscopy;  Laterality: N/A;  . DILATION AND CURETTAGE OF  UTERUS    . LEEP    . SALIVARY GLAND SURGERY     left   . THYROIDECTOMY    . TUBAL LIGATION     Family History  Problem Relation Age of Onset  . Cancer Mother        breast  . Diabetes Mother   . Heart disease Mother        CHF  . Hypertension Mother   . Hyperparathyroidism Mother   . Breast cancer Mother 63  . Diabetes Father   . Heart disease Father        CHF  . Hypertension Father   . Thyroid nodules Daughter   . Hypertension Son        ?  . Stroke Maternal Grandmother   . Cancer Maternal Grandmother        colon   . Cancer Paternal Grandfather        thyroid  . Diabetes Daughter        2   Social History   Socioeconomic History  . Marital status: Legally Separated    Spouse name: Not on file  . Number of children: Not on file  . Years of education: Not on file  . Highest education level: Not on file  Occupational History  . Not on file  Tobacco Use  .  Smoking status: Current Some Day Smoker  . Smokeless tobacco: Never Used  . Tobacco comment: 1/2 ppd   Vaping Use  . Vaping Use: Never used  Substance and Sexual Activity  . Alcohol use: Yes  . Drug use: Not Currently  . Sexual activity: Yes    Comment: men  Other Topics Concern  . 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 Blood Glucose Monitoring Suppl (GLUCOCOM BLOOD GLUCOSE MONITOR) DEVI Ok to fill per pt's insurance formulary  . Cholecalciferol (VITAMIN D3) 250 MCG (10000 UT) TABS Take by mouth.  . enalapril (VASOTEC) 10 MG tablet TAKE 1 TABLET (10 MG TOTAL) BY MOUTH DAILY.  Marland Kitchen FREESTYLE LITE test strip   . ipratropium  (ATROVENT) 0.06 % nasal spray Place 2 sprays into both nostrils 3 (three) times daily.  . Lancets (FREESTYLE) lancets   . levocetirizine (XYZAL) 5 MG tablet Take 1 tablet (5 mg total) by mouth daily as needed for allergies.  . [DISCONTINUED] ALPRAZolam (XANAX) 1 MG tablet TAKE 1 TABLET BY MOUTH DAILY AS NEEDED FOR ANXIETY. (Patient taking differently: Take by mouth daily as needed. for anxiety)  . [DISCONTINUED] atorvastatin (LIPITOR) 10 MG tablet TAKE 1 TABLET BY MOUTH DAILY AT 6:00PM  . [DISCONTINUED] cyclobenzaprine (FLEXERIL) 5 MG tablet TAKE 1 TABLET BY MOUTH AT BEDTIME AS NEEDED FOR MUSCLE SPASMS.  . [DISCONTINUED] Dulaglutide 1.5 MG/0.5ML SOPN INJECT 1.5 MG INTO THE SKIN ONCE A WEEK.  . [DISCONTINUED] levothyroxine (SYNTHROID) 125 MCG tablet TAKE 1 TABLET BY MOUTH AS DIRECTED. TAKE 8 TABLETS WEEKLY.  . [DISCONTINUED] omeprazole (PRILOSEC) 40 MG capsule TAKE 1 CAPSULE BY MOUTH DAILY  . [DISCONTINUED] traZODone (DESYREL) 100 MG tablet TAKE 1 TABLET BY MOUTH AT BEDTIME  . [DISCONTINUED] venlafaxine XR (EFFEXOR-XR) 75 MG 24 hr capsule TAKE 1 CAPSULE BY MOUTH DAILY WITH BREAKFAST   No Known Allergies Recent Results (from the past 2160 hour(s))  Comprehensive metabolic panel     Status: Abnormal   Collection Time: 02/23/21  1:50 PM  Result Value Ref Range   Sodium 140 135 - 145 mEq/L   Potassium 3.8 3.5 - 5.1 mEq/L   Chloride 105 96 - 112 mEq/L   CO2 28 19 - 32 mEq/L   Glucose, Bld 134 (H) 70 - 99 mg/dL   BUN 13 6 - 23 mg/dL   Creatinine, Ser 0.88 0.40 - 1.20 mg/dL   Total Bilirubin 0.3 0.2 - 1.2 mg/dL   Alkaline Phosphatase 63 39 - 117 U/L   AST 17 0 - 37 U/L   ALT 39 (H) 0 - 35 U/L   Total Protein 6.5 6.0 - 8.3 g/dL   Albumin 4.1 3.5 - 5.2 g/dL   GFR 74.76 >60.00 mL/min    Comment: Calculated using the CKD-EPI Creatinine Equation (2021)   Calcium 8.9 8.4 - 10.5 mg/dL  Lipid panel     Status: None   Collection Time: 02/23/21  1:50 PM  Result Value Ref Range   Cholesterol 135 0 -  200 mg/dL    Comment: ATP  III Classification       Desirable:  < 200 mg/dL               Borderline High:  200 - 239 mg/dL          High:  > = 240 mg/dL   Triglycerides 102.0 0.0 - 149.0 mg/dL    Comment: Normal:  <150 mg/dLBorderline High:  150 - 199 mg/dL   HDL 50.30 >39.00 mg/dL   VLDL 20.4 0.0 - 40.0 mg/dL   LDL Cholesterol 64 0 - 99 mg/dL   Total CHOL/HDL Ratio 3     Comment:                Men          Women1/2 Average Risk     3.4          3.3Average Risk          5.0          4.42X Average Risk          9.6          7.13X Average Risk          15.0          11.0                       NonHDL 84.62     Comment: NOTE:  Non-HDL goal should be 30 mg/dL higher than patient's LDL goal (i.e. LDL goal of < 70 mg/dL, would have non-HDL goal of < 100 mg/dL)  CBC with Differential/Platelet     Status: None   Collection Time: 02/23/21  1:50 PM  Result Value Ref Range   WBC 6.3 4.0 - 10.5 K/uL   RBC 4.31 3.87 - 5.11 Mil/uL   Hemoglobin 12.2 12.0 - 15.0 g/dL   HCT 36.8 36.0 - 46.0 %   MCV 85.4 78.0 - 100.0 fl   MCHC 33.1 30.0 - 36.0 g/dL   RDW 14.4 11.5 - 15.5 %   Platelets 313.0 150.0 - 400.0 K/uL   Neutrophils Relative % 58.8 43.0 - 77.0 %   Lymphocytes Relative 34.9 12.0 - 46.0 %   Monocytes Relative 4.2 3.0 - 12.0 %   Eosinophils Relative 1.6 0.0 - 5.0 %   Basophils Relative 0.5 0.0 - 3.0 %   Neutro Abs 3.7 1.4 - 7.7 K/uL   Lymphs Abs 2.2 0.7 - 4.0 K/uL   Monocytes Absolute 0.3 0.1 - 1.0 K/uL   Eosinophils Absolute 0.1 0.0 - 0.7 K/uL   Basophils Absolute 0.0 0.0 - 0.1 K/uL  Hemoglobin A1c     Status: Abnormal   Collection Time: 02/23/21  1:50 PM  Result Value Ref Range   Hgb A1c MFr Bld 10.3 (H) 4.6 - 6.5 %    Comment: Glycemic Control Guidelines for People with Diabetes:Non Diabetic:  <6%Goal of Therapy: <7%Additional Action Suggested:  >8%   TSH     Status: None   Collection Time: 02/23/21  1:50 PM  Result Value Ref Range   TSH 1.03 0.35 - 4.50 uIU/mL  T4, free     Status:  None   Collection Time: 02/23/21  1:50 PM  Result Value Ref Range   Free T4 0.90 0.60 - 1.60 ng/dL    Comment: Specimens from patients who are undergoing biotin therapy and /or ingesting biotin supplements may contain high levels of biotin.  The higher biotin concentration in these specimens interferes with this Free  T4 assay.  Specimens that contain high levels  of biotin may cause false high results for this Free T4 assay.  Please interpret results in light of the total clinical presentation of the patient.    T3, free     Status: None   Collection Time: 02/23/21  1:50 PM  Result Value Ref Range   T3, Free 3.1 2.3 - 4.2 pg/mL  Vitamin B12     Status: None   Collection Time: 02/23/21  1:50 PM  Result Value Ref Range   Vitamin B-12 492 211 - 911 pg/mL   Objective  Body mass index is 33.99 kg/m. Wt Readings from Last 3 Encounters:  02/23/21 230 lb 3.2 oz (104.4 kg)  03/17/20 227 lb (103 kg)  12/27/19 236 lb 6.4 oz (107.2 kg)   Temp Readings from Last 3 Encounters:  02/23/21 97.9 F (36.6 C) (Oral)  12/27/19 98.4 F (36.9 C)  10/13/19 98 F (36.7 C)   BP Readings from Last 3 Encounters:  02/23/21 118/78  03/17/20 132/76  12/27/19 138/88   Pulse Readings from Last 3 Encounters:  02/23/21 86  12/27/19 98  10/13/19 86    Physical Exam Vitals and nursing note reviewed.  Constitutional:      Appearance: Normal appearance. She is well-developed and well-groomed. She is obese.  HENT:     Head: Normocephalic and atraumatic.  Cardiovascular:     Rate and Rhythm: Normal rate and regular rhythm.     Heart sounds: Normal heart sounds. No murmur heard.   Pulmonary:     Effort: Pulmonary effort is normal.     Breath sounds: Normal breath sounds.  Abdominal:     Tenderness: There is no abdominal tenderness.  Skin:    General: Skin is warm and dry.  Neurological:     General: No focal deficit present.     Mental Status: She is alert and oriented to person, place, and  time. Mental status is at baseline.     Gait: Gait normal.  Psychiatric:        Attention and Perception: Attention and perception normal.        Mood and Affect: Mood and affect normal.        Speech: Speech normal.        Behavior: Behavior normal. Behavior is cooperative.        Thought Content: Thought content normal.        Cognition and Memory: Cognition and memory normal.        Judgment: Judgment normal.     Assessment  Plan  Annual physical exam Flu shotunknown if had 2021 Tdap had 12/08/15 pna 23 utd10/8/19 Hep A/B vx hadhep B immune Check Hep A titer in the future  Prev Disc shingrix vaccineh Per pt checked MMR and immune 2/2 pfizer covid had consider booster  Due colonoscopy h/o polypsnoted 09/18/15 pt wants repeat due to [place] mGM colon cancer  -09/15/18 normal repeat in 5 years with FH  Pap at f/u h/o abnormal pap and LEEP -08/25/18 negative negative HPV  Mammogram2/8/21 normal referred call to schedule  Smoker 1/2 ppd rec cessation rec cessation  CT chest for b/l lung nodules 6/12021 ordered repeat rec healthy diet and exercise   Allergic rhinitis, unspecified seasonality, unspecified trigger - Plan: levocetirizine (XYZAL) 5 MG tablet Post-nasal drip - Plan: levocetirizine (XYZAL) 5 MG tablet, ipratropium (ATROVENT) 0.06 % nasal spray  Hot flashes - Plan: venlafaxine XR (EFFEXOR-XR) 37.5 MG 24 hr capsule qd x 2  weeks then qod taper off   Anxiety - Plan: venlafaxine XR (EFFEXOR-XR) 37.5 MG 24 hr capsule, ALPRAZolam (XANAX) 1 MG tablet Wants to continue prn xanax  Anxiety and depression - Plan: venlafaxine XR (EFFEXOR-XR) 37.5 MG 24 hr capsule  Insomnia, unspecified type - Plan: traZODone (DESYREL) 100 MG tablet  Gastroesophageal reflux disease - Plan: omeprazole (PRILOSEC) 40 MG capsule  Bronchitis - Plan: albuterol (VENTOLIN HFA) 108 (90 Base) MCG/ACT inhaler  Hyperlipidemia, unspecified hyperlipidemia type - Plan: atorvastatin  (LIPITOR) 10 MG tablet  Cervicalgia - Plan: cyclobenzaprine (FLEXERIL) 5 MG tablet Chronic back pain, unspecified back location, unspecified back pain laterality - Plan: cyclobenzaprine (FLEXERIL) 5 MG tablet  Essential hypertension - Plan: hydrochlorothiazide (HYDRODIURIL) 12.5 MG tablet, vasotec 10 mg   Hypertension associated with diabetes ([place]) - Plan: Comprehensive metabolic panel, Lipid panel, CBC with Differential/Platelet, Hemoglobin A1c 10.3 today uncontrolled, Urinalysis, Routine w reflex microscopic, Microalbumin / creatinine urine ratio, hydrochlorothiazide (HYDRODIURIL) 12.5 MG tablet vasotec 10 mg qd On trulicity but has been off for a while could not get refill endocrine  -with appt 04/18/21 endocrine will ask if she should be on this with h/o thyroid cancer?  Foot exam today  Pt needs to call and sch eye exam [facility]   Thyroid cancer ([place]) - Plan: TSH, T4, free, T3, free, Thyroglobulin antibody Postoperative hypothyroidism - Plan: TSH, T4, free, T3, free, Thyroglobulin antibody On levo 125 mcg M-Sat and Sunday 250 mcg   B12 deficiency - Plan: Vitamin B12   Provider Dr. Olivia Mackie McLean-Scocuzza-Internal Medicine

## 2021-02-23 NOTE — Addendum Note (Signed)
Addended by: Orland Mustard on: 02/23/2021 01:15 PM   Modules accepted: Orders

## 2021-02-24 LAB — URINALYSIS, ROUTINE W REFLEX MICROSCOPIC
Bilirubin, UA: NEGATIVE
Glucose, UA: NEGATIVE
Ketones, UA: NEGATIVE
Leukocytes,UA: NEGATIVE
Nitrite, UA: NEGATIVE
Protein,UA: NEGATIVE
RBC, UA: NEGATIVE
Specific Gravity, UA: 1.016 (ref 1.005–1.030)
Urobilinogen, Ur: 0.2 mg/dL (ref 0.2–1.0)
pH, UA: 5 (ref 5.0–7.5)

## 2021-02-24 LAB — THYROGLOBULIN ANTIBODY: Thyroglobulin Antibody: <1 IU/mL (ref 0.0–0.9)

## 2021-02-24 LAB — MICROALBUMIN / CREATININE URINE RATIO
Creatinine, Urine: 76.1 mg/dL
Microalb/Creat Ratio: <4 mg/g creat (ref 0–29)
Microalbumin, Urine: <3 ug/mL

## 2021-02-26 ENCOUNTER — Telehealth: Payer: Self-pay

## 2021-02-26 NOTE — Telephone Encounter (Signed)
LVM to Return in about 6 months (around 08/25/2021) for 3-6 months

## 2021-03-09 ENCOUNTER — Ambulatory Visit: Payer: 59

## 2021-03-13 ENCOUNTER — Other Ambulatory Visit: Payer: Self-pay

## 2021-03-13 MED ORDER — TRULICITY 3 MG/0.5ML ~~LOC~~ SOAJ
SUBCUTANEOUS | 3 refills | Status: DC
  Filled 2021-03-13: qty 2, 28d supply, fill #0
  Filled 2021-04-19 – 2021-07-04 (×2): qty 2, 28d supply, fill #1
  Filled 2021-07-25: qty 2, 28d supply, fill #2
  Filled 2021-09-06: qty 2, 28d supply, fill #3
  Filled 2021-10-03: qty 2, 28d supply, fill #4

## 2021-03-13 MED ORDER — METFORMIN HCL ER 500 MG PO TB24
ORAL_TABLET | ORAL | 6 refills | Status: DC
  Filled 2021-03-13: qty 30, 30d supply, fill #0
  Filled 2021-04-19: qty 30, 30d supply, fill #1
  Filled 2021-05-09: qty 30, 30d supply, fill #2
  Filled 2021-06-14: qty 30, 30d supply, fill #3
  Filled 2021-07-16: qty 30, 30d supply, fill #4
  Filled 2021-08-10: qty 30, 30d supply, fill #5

## 2021-03-22 ENCOUNTER — Other Ambulatory Visit: Payer: Self-pay

## 2021-03-22 MED ORDER — LEVOTHYROXINE SODIUM 150 MCG PO TABS
ORAL_TABLET | ORAL | 3 refills | Status: DC
  Filled 2021-03-22: qty 30, 30d supply, fill #0
  Filled 2021-06-15: qty 90, 90d supply, fill #1

## 2021-03-30 ENCOUNTER — Other Ambulatory Visit: Payer: Self-pay

## 2021-03-30 MED FILL — Enalapril Maleate Tab 10 MG: ORAL | 30 days supply | Qty: 30 | Fill #0 | Status: AC

## 2021-04-18 ENCOUNTER — Ambulatory Visit: Payer: Self-pay | Attending: Internal Medicine

## 2021-04-19 ENCOUNTER — Other Ambulatory Visit: Payer: Self-pay

## 2021-04-19 MED FILL — Enalapril Maleate Tab 10 MG: ORAL | 30 days supply | Qty: 30 | Fill #1 | Status: AC

## 2021-05-09 ENCOUNTER — Other Ambulatory Visit: Payer: Self-pay

## 2021-05-11 ENCOUNTER — Encounter: Payer: Self-pay | Admitting: Internal Medicine

## 2021-06-14 ENCOUNTER — Other Ambulatory Visit: Payer: Self-pay

## 2021-06-14 MED FILL — Enalapril Maleate Tab 10 MG: ORAL | 30 days supply | Qty: 30 | Fill #2 | Status: AC

## 2021-06-15 ENCOUNTER — Other Ambulatory Visit: Payer: Self-pay

## 2021-07-04 ENCOUNTER — Other Ambulatory Visit: Payer: Self-pay

## 2021-07-16 ENCOUNTER — Other Ambulatory Visit: Payer: Self-pay

## 2021-07-16 MED FILL — Enalapril Maleate Tab 10 MG: ORAL | 30 days supply | Qty: 30 | Fill #3 | Status: AC

## 2021-07-24 ENCOUNTER — Encounter: Payer: Self-pay | Admitting: Internal Medicine

## 2021-07-25 ENCOUNTER — Other Ambulatory Visit: Payer: Self-pay

## 2021-07-26 ENCOUNTER — Other Ambulatory Visit: Payer: Self-pay

## 2021-08-10 ENCOUNTER — Other Ambulatory Visit: Payer: Self-pay

## 2021-08-10 MED FILL — Enalapril Maleate Tab 10 MG: ORAL | 30 days supply | Qty: 30 | Fill #4 | Status: AC

## 2021-08-17 ENCOUNTER — Other Ambulatory Visit: Payer: Self-pay

## 2021-08-17 MED ORDER — TRULICITY 3 MG/0.5ML ~~LOC~~ SOAJ
SUBCUTANEOUS | 1 refills | Status: DC
  Filled 2021-08-17: qty 2, 28d supply, fill #0
  Filled 2021-10-31: qty 2, 28d supply, fill #1
  Filled 2021-11-13: qty 2, 28d supply, fill #2

## 2021-08-17 MED ORDER — TRESIBA FLEXTOUCH 100 UNIT/ML ~~LOC~~ SOPN
PEN_INJECTOR | SUBCUTANEOUS | 1 refills | Status: DC
  Filled 2021-08-17: qty 6, 30d supply, fill #0
  Filled 2021-10-09: qty 6, 30d supply, fill #1
  Filled 2021-11-13: qty 6, 30d supply, fill #2

## 2021-08-17 MED ORDER — METFORMIN HCL ER 500 MG PO TB24
ORAL_TABLET | ORAL | 3 refills | Status: DC
  Filled 2021-08-17 (×2): qty 60, 30d supply, fill #0
  Filled 2021-10-03: qty 60, 30d supply, fill #1
  Filled 2021-11-13: qty 60, 30d supply, fill #2

## 2021-08-22 ENCOUNTER — Other Ambulatory Visit: Payer: Self-pay

## 2021-08-23 ENCOUNTER — Other Ambulatory Visit: Payer: Self-pay

## 2021-09-06 ENCOUNTER — Telehealth: Payer: Self-pay | Admitting: Internal Medicine

## 2021-09-06 ENCOUNTER — Other Ambulatory Visit: Payer: Self-pay | Admitting: Internal Medicine

## 2021-09-06 ENCOUNTER — Other Ambulatory Visit: Payer: Self-pay

## 2021-09-06 DIAGNOSIS — F419 Anxiety disorder, unspecified: Secondary | ICD-10-CM

## 2021-09-06 MED FILL — Enalapril Maleate Tab 10 MG: ORAL | 30 days supply | Qty: 30 | Fill #5 | Status: AC

## 2021-09-06 NOTE — Telephone Encounter (Signed)
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

## 2021-09-06 NOTE — Telephone Encounter (Signed)
RX Refill: xanax Last Seen: 02-23-21 Last Ordered: 10-02-20 Next Appt: NA

## 2021-09-06 NOTE — Telephone Encounter (Signed)
Refill Request has been sent to provider for determination.

## 2021-09-07 ENCOUNTER — Other Ambulatory Visit: Payer: Self-pay

## 2021-09-07 ENCOUNTER — Other Ambulatory Visit: Payer: Self-pay | Admitting: Internal Medicine

## 2021-09-07 DIAGNOSIS — F419 Anxiety disorder, unspecified: Secondary | ICD-10-CM

## 2021-09-07 NOTE — Telephone Encounter (Signed)
Last filled 07/25/21 last OV 02/23/21 no fU scheduled.

## 2021-09-08 ENCOUNTER — Other Ambulatory Visit: Payer: Self-pay

## 2021-09-10 ENCOUNTER — Other Ambulatory Visit: Payer: Self-pay

## 2021-09-12 ENCOUNTER — Other Ambulatory Visit: Payer: Self-pay | Admitting: Internal Medicine

## 2021-09-12 ENCOUNTER — Other Ambulatory Visit: Payer: Self-pay

## 2021-09-12 DIAGNOSIS — F419 Anxiety disorder, unspecified: Secondary | ICD-10-CM

## 2021-09-12 NOTE — Telephone Encounter (Signed)
RX Refill: xanax Last Seen: 02-23-21 Last Ordered: 10-02-21 Next Appt: NA

## 2021-09-13 ENCOUNTER — Other Ambulatory Visit: Payer: Self-pay

## 2021-09-13 ENCOUNTER — Other Ambulatory Visit: Payer: Self-pay | Admitting: Internal Medicine

## 2021-09-13 DIAGNOSIS — F419 Anxiety disorder, unspecified: Secondary | ICD-10-CM

## 2021-09-13 MED FILL — Alprazolam Tab 1 MG: ORAL | 30 days supply | Qty: 30 | Fill #0 | Status: AC

## 2021-09-13 NOTE — Telephone Encounter (Signed)
RX Refill: xanax Last Seen: 02-23-21 Last Ordered: 10-02-20 Next Appt: NA

## 2021-09-14 ENCOUNTER — Other Ambulatory Visit: Payer: Self-pay

## 2021-10-03 ENCOUNTER — Other Ambulatory Visit: Payer: Self-pay

## 2021-10-03 MED FILL — Enalapril Maleate Tab 10 MG: ORAL | 30 days supply | Qty: 30 | Fill #6 | Status: AC

## 2021-10-09 ENCOUNTER — Other Ambulatory Visit: Payer: Self-pay

## 2021-10-12 ENCOUNTER — Other Ambulatory Visit: Payer: Self-pay

## 2021-10-12 MED FILL — Alprazolam Tab 1 MG: ORAL | 30 days supply | Qty: 30 | Fill #1 | Status: AC

## 2021-10-31 ENCOUNTER — Other Ambulatory Visit: Payer: Self-pay

## 2021-11-13 ENCOUNTER — Other Ambulatory Visit: Payer: Self-pay

## 2021-11-13 MED FILL — Enalapril Maleate Tab 10 MG: ORAL | 30 days supply | Qty: 30 | Fill #7 | Status: AC

## 2021-11-13 MED FILL — Alprazolam Tab 1 MG: ORAL | 30 days supply | Qty: 30 | Fill #2 | Status: AC

## 2021-11-23 ENCOUNTER — Other Ambulatory Visit: Payer: Self-pay

## 2021-11-23 LAB — HEMOGLOBIN A1C: Hemoglobin A1C: 6.9

## 2021-11-23 MED ORDER — TRULICITY 4.5 MG/0.5ML ~~LOC~~ SOAJ
SUBCUTANEOUS | 1 refills | Status: DC
  Filled 2021-11-23: qty 2, 28d supply, fill #0
  Filled 2021-12-24: qty 2, 28d supply, fill #1
  Filled 2022-02-14 – 2022-02-15 (×2): qty 2, 28d supply, fill #2
  Filled 2022-03-11 – 2022-03-15 (×4): qty 2, 28d supply, fill #3
  Filled 2022-07-04: qty 2, 28d supply, fill #4
  Filled 2022-10-24: qty 2, 28d supply, fill #5

## 2021-11-23 MED ORDER — METFORMIN HCL ER 500 MG PO TB24
ORAL_TABLET | ORAL | 1 refills | Status: DC
  Filled 2021-11-23: qty 120, 30d supply, fill #0

## 2021-11-26 ENCOUNTER — Other Ambulatory Visit: Payer: Self-pay

## 2021-11-30 ENCOUNTER — Other Ambulatory Visit: Payer: Self-pay

## 2021-12-24 ENCOUNTER — Other Ambulatory Visit: Payer: Self-pay | Admitting: Internal Medicine

## 2021-12-24 ENCOUNTER — Other Ambulatory Visit: Payer: Self-pay

## 2021-12-24 DIAGNOSIS — F419 Anxiety disorder, unspecified: Secondary | ICD-10-CM

## 2021-12-24 MED ORDER — ALPRAZOLAM 1 MG PO TABS
ORAL_TABLET | ORAL | 2 refills | Status: DC
  Filled 2021-12-24: qty 30, 30d supply, fill #0

## 2021-12-24 MED ORDER — LEVOTHYROXINE SODIUM 125 MCG PO TABS
ORAL_TABLET | ORAL | 1 refills | Status: DC
  Filled 2021-12-24: qty 34, 30d supply, fill #0
  Filled 2022-02-15: qty 34, 30d supply, fill #1
  Filled 2022-05-03: qty 30, 26d supply, fill #2
  Filled 2022-07-04: qty 30, 26d supply, fill #3

## 2021-12-24 MED FILL — Enalapril Maleate Tab 10 MG: ORAL | 30 days supply | Qty: 30 | Fill #8 | Status: CN

## 2021-12-25 ENCOUNTER — Ambulatory Visit (INDEPENDENT_AMBULATORY_CARE_PROVIDER_SITE_OTHER): Payer: 59 | Admitting: Internal Medicine

## 2021-12-25 ENCOUNTER — Encounter: Payer: Self-pay | Admitting: Internal Medicine

## 2021-12-25 ENCOUNTER — Other Ambulatory Visit: Payer: Self-pay

## 2021-12-25 VITALS — BP 138/78 | HR 95 | Temp 98.2°F | Ht 69.0 in | Wt 222.4 lb

## 2021-12-25 DIAGNOSIS — M25511 Pain in right shoulder: Secondary | ICD-10-CM

## 2021-12-25 DIAGNOSIS — K760 Fatty (change of) liver, not elsewhere classified: Secondary | ICD-10-CM

## 2021-12-25 DIAGNOSIS — J40 Bronchitis, not specified as acute or chronic: Secondary | ICD-10-CM

## 2021-12-25 DIAGNOSIS — Z0184 Encounter for antibody response examination: Secondary | ICD-10-CM

## 2021-12-25 DIAGNOSIS — I152 Hypertension secondary to endocrine disorders: Secondary | ICD-10-CM

## 2021-12-25 DIAGNOSIS — Z1231 Encounter for screening mammogram for malignant neoplasm of breast: Secondary | ICD-10-CM

## 2021-12-25 DIAGNOSIS — K219 Gastro-esophageal reflux disease without esophagitis: Secondary | ICD-10-CM | POA: Diagnosis not present

## 2021-12-25 DIAGNOSIS — G4700 Insomnia, unspecified: Secondary | ICD-10-CM

## 2021-12-25 DIAGNOSIS — M25512 Pain in left shoulder: Secondary | ICD-10-CM

## 2021-12-25 DIAGNOSIS — I10 Essential (primary) hypertension: Secondary | ICD-10-CM

## 2021-12-25 DIAGNOSIS — F419 Anxiety disorder, unspecified: Secondary | ICD-10-CM

## 2021-12-25 DIAGNOSIS — J309 Allergic rhinitis, unspecified: Secondary | ICD-10-CM

## 2021-12-25 DIAGNOSIS — M549 Dorsalgia, unspecified: Secondary | ICD-10-CM

## 2021-12-25 DIAGNOSIS — E1159 Type 2 diabetes mellitus with other circulatory complications: Secondary | ICD-10-CM

## 2021-12-25 DIAGNOSIS — Z23 Encounter for immunization: Secondary | ICD-10-CM

## 2021-12-25 DIAGNOSIS — R768 Other specified abnormal immunological findings in serum: Secondary | ICD-10-CM

## 2021-12-25 DIAGNOSIS — G8929 Other chronic pain: Secondary | ICD-10-CM

## 2021-12-25 DIAGNOSIS — R0982 Postnasal drip: Secondary | ICD-10-CM

## 2021-12-25 DIAGNOSIS — E785 Hyperlipidemia, unspecified: Secondary | ICD-10-CM

## 2021-12-25 DIAGNOSIS — M542 Cervicalgia: Secondary | ICD-10-CM

## 2021-12-25 MED ORDER — LEVOCETIRIZINE DIHYDROCHLORIDE 5 MG PO TABS
5.0000 mg | ORAL_TABLET | Freq: Every day | ORAL | 3 refills | Status: DC | PRN
  Filled 2021-12-25: qty 30, 30d supply, fill #0
  Filled 2022-10-24: qty 90, 90d supply, fill #0

## 2021-12-25 MED ORDER — TRIAMTERENE-HCTZ 37.5-25 MG PO TABS
1.0000 | ORAL_TABLET | Freq: Every day | ORAL | 3 refills | Status: DC
  Filled 2021-12-25: qty 30, 30d supply, fill #0
  Filled 2022-01-21: qty 30, 30d supply, fill #1
  Filled 2022-02-14: qty 30, 30d supply, fill #2
  Filled 2022-03-28: qty 30, 30d supply, fill #3
  Filled 2022-05-03: qty 30, 30d supply, fill #4
  Filled 2022-05-30: qty 30, 30d supply, fill #5
  Filled 2022-07-04: qty 30, 30d supply, fill #6
  Filled 2022-07-30: qty 30, 30d supply, fill #7
  Filled 2022-09-02: qty 30, 30d supply, fill #8
  Filled 2022-09-26: qty 30, 30d supply, fill #9
  Filled 2022-12-03: qty 30, 30d supply, fill #10

## 2021-12-25 MED ORDER — ENALAPRIL MALEATE 10 MG PO TABS: ORAL_TABLET | Freq: Every day | ORAL | 3 refills | Status: DC

## 2021-12-25 MED ORDER — ALPRAZOLAM 1 MG PO TABS
ORAL_TABLET | ORAL | 5 refills | Status: DC
  Filled 2021-12-25: qty 30, fill #0
  Filled 2021-12-25: qty 30, 30d supply, fill #0
  Filled 2022-01-21: qty 30, 30d supply, fill #1
  Filled 2022-02-25: qty 30, 30d supply, fill #2
  Filled 2022-03-28: qty 30, 30d supply, fill #3
  Filled 2022-05-03: qty 30, 30d supply, fill #4
  Filled 2022-05-30 – 2022-05-31 (×2): qty 30, 30d supply, fill #5

## 2021-12-25 MED ORDER — ALBUTEROL SULFATE HFA 108 (90 BASE) MCG/ACT IN AERS
INHALATION_SPRAY | RESPIRATORY_TRACT | 11 refills | Status: DC
Start: 2021-12-25 — End: 2023-08-14
  Filled 2021-12-25: qty 8.5, 16d supply, fill #0

## 2021-12-25 MED ORDER — ATORVASTATIN CALCIUM 10 MG PO TABS
10.0000 mg | ORAL_TABLET | ORAL | 3 refills | Status: DC
  Filled 2021-12-25: qty 15, 30d supply, fill #0
  Filled 2022-03-28: qty 15, 30d supply, fill #1
  Filled 2022-05-03: qty 15, 30d supply, fill #2
  Filled 2022-05-30: qty 15, 30d supply, fill #3
  Filled 2022-08-26: qty 15, 30d supply, fill #4
  Filled 2022-09-26: qty 15, 30d supply, fill #5

## 2021-12-25 MED ORDER — IPRATROPIUM BROMIDE 0.06 % NA SOLN
2.0000 | Freq: Three times a day (TID) | NASAL | 12 refills | Status: DC
  Filled 2021-12-25: qty 15, 25d supply, fill #0

## 2021-12-25 MED ORDER — OMEPRAZOLE 40 MG PO CPDR
DELAYED_RELEASE_CAPSULE | Freq: Every day | ORAL | 3 refills | Status: DC
  Filled 2021-12-25: qty 30, 30d supply, fill #0

## 2021-12-25 MED ORDER — CYCLOBENZAPRINE HCL 5 MG PO TABS
ORAL_TABLET | ORAL | 11 refills | Status: AC
  Filled 2021-12-25: qty 30, 30d supply, fill #0

## 2021-12-25 MED ORDER — LOSARTAN POTASSIUM 25 MG PO TABS
12.5000 mg | ORAL_TABLET | Freq: Every day | ORAL | 3 refills | Status: DC
  Filled 2021-12-25: qty 15, 30d supply, fill #0
  Filled 2022-01-21: qty 15, 30d supply, fill #1
  Filled 2022-02-14: qty 15, 30d supply, fill #2
  Filled 2022-03-28: qty 15, 30d supply, fill #3
  Filled 2022-05-03: qty 15, 30d supply, fill #4
  Filled 2022-05-30: qty 15, 30d supply, fill #5
  Filled 2022-07-04: qty 15, 30d supply, fill #6
  Filled 2022-07-30: qty 15, 30d supply, fill #7
  Filled 2022-09-02: qty 15, 30d supply, fill #8
  Filled 2022-09-26: qty 15, 30d supply, fill #9

## 2021-12-25 MED ORDER — METFORMIN HCL ER 500 MG PO TB24: 500.0000 mg | ORAL_TABLET | Freq: Two times a day (BID) | ORAL | 6 refills | Status: DC

## 2021-12-25 MED ORDER — TRAZODONE HCL 100 MG PO TABS
ORAL_TABLET | Freq: Every day | ORAL | 3 refills | Status: DC
  Filled 2021-12-25: qty 30, 30d supply, fill #0

## 2021-12-25 NOTE — Progress Notes (Signed)
Chief Complaint  Patient presents with   Medication Refill   F/u  1. Htn on hctz 25 mg qd and ealapril 10 mg qd mom having angioedema to ACEI  Out of hctz 25  2. Dm 2 a1c was 74.1 to 6.9 on trulicity 4.5 weekly metformin 500 xr bid endo rec 11/2021 1000 xr bid she has not tried yet and off tresiba 10 units  3. H/o thyroid cancer [facility] pending to be scheduled PET/CT/thyroid scans  F[facility]u [facility] dm 2 and thyroid cancer f/u on levo 125 mcg qd   4. B/l shoulder pains h/o + ANA will repeat labs upcoming declines today    Review of Systems  Constitutional:  Negative for weight loss.  HENT:  Negative for hearing loss.   Eyes:  Negative for blurred vision.  Respiratory:  Negative for shortness of breath.   Cardiovascular:  Negative for chest pain.  Gastrointestinal:  Negative for abdominal pain and blood in stool.  Genitourinary:  Negative for dysuria.  Musculoskeletal:  Positive for joint pain. Negative for falls.  Skin:  Negative for rash.  Neurological:  Negative for headaches.  Psychiatric/Behavioral:  Negative for depression.   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    Colon polyps    Depression    Diabetes mellitus without complication (HCC)    GERD (gastroesophageal reflux disease)    Herpes    1+2   Hyperlipidemia    Hypertension    OSA on CPAP    Thyroid disease    Past Surgical History:  Procedure Laterality Date   COLONOSCOPY WITH PROPOFOL N/A 09/15/2018   Procedure: COLONOSCOPY WITH PROPOFOL;  Surgeon: [name], MD;  Location: [facility] ENDOSCOPY;  Service: Endoscopy;  Laterality: N/A;   DILATION AND CURETTAGE OF UTERUS     LEEP     SALIVARY GLAND SURGERY     left    THYROIDECTOMY     TUBAL LIGATION     Family History  Problem Relation Age of Onset   Cancer Mother        breast   Diabetes Mother    Heart disease Mother        CHF    Hypertension Mother    Hyperparathyroidism Mother    Breast cancer Mother 50   Diabetes Father    Heart disease Father        CHF   Hypertension Father    Thyroid nodules Daughter    Hypertension Son        ?   Stroke Maternal Grandmother    Cancer Maternal Grandmother        colon    Cancer Paternal Grandfather        thyroid   Diabetes Daughter        2   Social History   Socioeconomic History   Marital status: Legally Separated    Spouse name: Not on file   Number of children: Not on file   Years of education: Not on file   Highest education level: Not on file  Occupational History   Not on file  Tobacco Use   Smoking status: Some Days   Smokeless tobacco: Never   Tobacco comments:    1/2 ppd   Vaping Use   Vaping Use: Never used  Substance and Sexual Activity   Alcohol use:  Yes   Drug use: Not Currently   Sexual activity: Yes    Comment: men  Other Topics Concern   Not on file  Social History Narrative   3 kids (2 girls and 1 boy)   Therapist, 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 D/c enalapril   triamterene-hydrochlorothiazide (MAXZIDE-25) 37.5-25 MG tablet Take 1 tablet by mouth daily. Dc hctz 25 alone   No Known Allergies Recent Results (from the past 2160 hour(s))  Hemoglobin A1c     Status: None   Collection Time: 11/23/21 12:00 AM  Result Value Ref Range   Hemoglobin A1C 6.9     Comment: [facility]   Objective  Body mass index is 32.84 kg/m. Wt Readings from Last 3 Encounters:  12/25/21 222 lb 6.4 oz (100.9 kg)  02/23/21 230 lb 3.2 oz (104.4 kg)  03/17/20 227 lb (103  kg)   Temp Readings from Last 3 Encounters:  12/25/21 98.2 F (36.8 C) (Oral)  02/23/21 97.9 F (36.6 C) (Oral)  12/27/19 98.4 F (36.9 C)   BP Readings from Last 3 Encounters:  12/25/21 138/78  02/23/21 118/78  03/17/20 132/76   Pulse Readings from Last 3 Encounters:  12/25/21 95  02/23/21 86  12/27/19 98    Physical Exam Vitals and nursing note reviewed.  Constitutional:      Appearance: Normal appearance. She is well-developed and well-groomed.  HENT:     Head: Normocephalic and atraumatic.  Eyes:     Conjunctiva/sclera: Conjunctivae normal.     Pupils: Pupils are equal, round, and reactive to light.  Cardiovascular:     Rate and Rhythm: Normal rate and regular rhythm.     Heart sounds: Normal heart sounds. No murmur heard. Pulmonary:     Effort: Pulmonary effort is normal.     Breath sounds: Normal breath sounds.  Abdominal:     General: Abdomen is flat. Bowel sounds are normal.     Tenderness: There is no abdominal tenderness.  Musculoskeletal:        General: No tenderness.  Skin:    General: Skin is warm and dry.  Neurological:     General: No focal deficit present.     Mental Status: She is alert and oriented to person, place, and time. Mental status is at baseline.     Cranial Nerves: Cranial nerves 2-12 are intact.     Sensory: Sensation is intact.     Motor: Motor function is intact.     Coordination: Coordination is intact.     Gait: Gait is intact.  Psychiatric:        Attention and Perception: Attention and perception normal.        Mood and Affect: Mood and affect normal.        Speech: Speech normal.        Behavior: Behavior normal. Behavior is cooperative.        Thought Content: Thought content normal.        Cognition and Memory: Cognition and memory normal.  Judgment: Judgment normal.    Assessment  Plan  Essential hypertension - Plan: DISCONTINUED: enalapril (VASOTEC) 10 MG tablet add losartan 12.5 mg qd and maxzide 37.5-25   High K food list  Insomnia, unspecified type - Plan: traZODone (DESYREL) 100 MG tablet  Gastroesophageal reflux disease - Plan: omeprazole (PRILOSEC) 40 MG capsule  Allergic rhinitis, unspecified seasonality, unspecified trigger - Plan: levocetirizine (XYZAL) 5 MG tablet  Post-nasal drip - Plan: levocetirizine (XYZAL) 5 MG tablet, ipratropium (ATROVENT) 0.06 % nasal spray  Hypertension associated with diabetes ([place]) V5Q 6.9  On trulicity 4.5 weekly and metformin [facility] for thyroid and DM 2  Cervicalgia - Plan: cyclobenzaprine (FLEXERIL) 5 MG tablet  Chronic back pain, unspecified back location, unspecified back pain laterality - Plan: cyclobenzaprine (FLEXERIL) 5 MG tablet  Hyperlipidemia, unspecified hyperlipidemia type - Plan: atorvastatin (LIPITOR) 10 MG tablet  Anxiety disorder, unspecified - Plan: ALPRAZolam (XANAX) 1 MG tablet  HM Flu shot 08/09/21 Tdap had 12/08/15  pna 23 utd 08/25/18 Prevnar x 1 given today  Hep A/B vx had hep B immune  Check Hep A titer in the future  Prev Disc shingrix vaccine h Per pt checked MMR and immune  3/3 pfizer covid had consider booster   Due colonoscopy h/o polyps noted 09/18/15 pt wants repeat due to [place] mGM colon cancer early 60s -09/15/18 normal repeat in 5 years with FH   Pap at f/u h/o abnormal pap and LEEP -08/25/18 negative negative HPV Due at f/u    Mammogram 12/27/19 normal referred call to schedule ordered    Smoker 1/2 ppd rec cessation  rec cessation    CT chest for b/l lung nodules 6/12021 ordered repeat -as of 12/24/21 declines   rec healthy diet and exercise    Provider: Dr. Olivia Mackie McLean-Scocuzza-Internal Medicine

## 2021-12-25 NOTE — Patient Instructions (Addendum)
[facility] as given the info before for both  [facility] [place]  [place] [address] [phone]    [facility] [place]  [address]  [place] [address]  [phone]    Consider eye exam patty vision    Prevnar given today   Pneumococcal Conjugate Vaccine (Prevnar 13) Suspension for Injection What is this medication? PNEUMOCOCCAL VACCINE (NEU mo KOK al vak SEEN) is a vaccine used to prevent pneumococcus bacterial infections. These bacteria can cause serious infections like pneumonia, meningitis, and blood infections. This vaccine will lower your chance of getting pneumonia. If you do get pneumonia, it can make your symptoms milder and your illness shorter. This vaccine will not treat an infection and will not cause infection. This vaccine is recommended for infants and young children, adults with certain medical conditions, and adults 78 years or older. This medicine may be used for other purposes; ask your health care provider or pharmacist if you have questions. COMMON BRAND NAME(S): Prevnar, Prevnar 13 What should I tell my care team before I take this medication? They need to know if you have any of these conditions: bleeding problems fever immune system problems an unusual or allergic reaction to pneumococcal vaccine, diphtheria toxoid, other vaccines, latex, other medicines, foods, dyes, or preservatives pregnant or trying to get pregnant breast-feeding How should I use this medication? This vaccine is for injection into a muscle. It is given by a health care professional. A copy of Vaccine Information Statements will be given before each vaccination. Read this sheet carefully each time. The sheet may change frequently. Talk to your pediatrician regarding the use of this medicine in children. While this drug may be prescribed for children as young as 38 weeks old for selected  conditions, precautions do apply. Overdosage: If you think you have taken too much of this medicine contact a poison control center or emergency room at once. NOTE: This medicine is only for you. Do not share this medicine with others. What if I miss a dose? It is important not to miss your dose. Call your doctor or health care professional if you are unable to keep an appointment. What may interact with this medication? medicines for cancer chemotherapy medicines that suppress your immune function steroid medicines like prednisone or cortisone This list may not describe all possible interactions. Give your health care provider a list of all the medicines, herbs, non-prescription drugs, or dietary supplements you use. Also tell them if you smoke, drink alcohol, or use illegal drugs. Some items may interact with your medicine. What should I watch for while using this medication? Mild fever and pain should go away in 3 days or less. Report any unusual symptoms to your doctor or health care professional. What side effects may I notice from receiving this medication? Side effects that you should report to your doctor or health care professional as soon as possible: allergic reactions like skin rash, itching or hives, swelling of the face, lips, or tongue breathing problems confused fast or irregular heartbeat fever over 102 degrees F seizures unusual bleeding or bruising unusual muscle weakness Side effects that usually do not require medical attention (report to your doctor or health care professional if they continue or are bothersome): aches and pains diarrhea fever of 102 degrees F or less headache irritable loss of appetite pain, tender at site where injected trouble sleeping This list may not describe all possible side  effects. Call your doctor for medical advice about side effects. You may report side effects to FDA at 1-800-FDA-1088. Where should I keep my medication? This does  not apply. This vaccine is given in a clinic, pharmacy, doctor's office, or other health care setting and will not be stored at home. NOTE: This sheet is a summary. It may not cover all possible information. If you have questions about this medicine, talk to your doctor, pharmacist, or health care provider.  2022 Elsevier/Gold Standard (2014-08-11 00:00:00)

## 2022-01-01 ENCOUNTER — Other Ambulatory Visit (HOSPITAL_COMMUNITY): Payer: Self-pay

## 2022-01-17 ENCOUNTER — Other Ambulatory Visit: Payer: Self-pay

## 2022-01-21 ENCOUNTER — Other Ambulatory Visit: Payer: Self-pay

## 2022-01-22 ENCOUNTER — Other Ambulatory Visit: Payer: Self-pay

## 2022-01-24 ENCOUNTER — Other Ambulatory Visit: Payer: Self-pay

## 2022-01-25 ENCOUNTER — Other Ambulatory Visit: Payer: Self-pay

## 2022-01-28 ENCOUNTER — Other Ambulatory Visit: Payer: Self-pay

## 2022-01-29 ENCOUNTER — Other Ambulatory Visit: Payer: Self-pay

## 2022-02-14 ENCOUNTER — Other Ambulatory Visit: Payer: Self-pay

## 2022-02-15 ENCOUNTER — Other Ambulatory Visit: Payer: Self-pay

## 2022-02-25 ENCOUNTER — Other Ambulatory Visit: Payer: Self-pay

## 2022-03-11 ENCOUNTER — Other Ambulatory Visit: Payer: Self-pay

## 2022-03-14 ENCOUNTER — Other Ambulatory Visit: Payer: Self-pay

## 2022-03-14 MED ORDER — METFORMIN HCL ER 500 MG PO TB24
ORAL_TABLET | ORAL | 1 refills | Status: DC
  Filled 2022-03-14: qty 120, 30d supply, fill #0

## 2022-03-15 ENCOUNTER — Other Ambulatory Visit: Payer: Self-pay

## 2022-03-28 ENCOUNTER — Other Ambulatory Visit: Payer: Self-pay

## 2022-04-03 ENCOUNTER — Other Ambulatory Visit: Payer: Self-pay

## 2022-04-03 MED ORDER — ATORVASTATIN CALCIUM 10 MG PO TABS
ORAL_TABLET | ORAL | 3 refills | Status: DC
  Filled 2022-04-03: qty 15, 30d supply, fill #0
  Filled 2022-10-24: qty 45, 90d supply, fill #0

## 2022-04-03 MED ORDER — LEVOTHYROXINE SODIUM 125 MCG PO TABS
125.0000 ug | ORAL_TABLET | Freq: Every day | ORAL | 3 refills | Status: DC
Start: 2022-04-03 — End: 2023-01-21
  Filled 2022-04-03: qty 30, 30d supply, fill #0
  Filled 2022-08-26: qty 30, 30d supply, fill #1

## 2022-04-03 MED ORDER — LOSARTAN POTASSIUM 25 MG PO TABS
ORAL_TABLET | ORAL | 3 refills | Status: DC
  Filled 2022-04-03: qty 15, 30d supply, fill #0
  Filled 2022-10-24: qty 45, 90d supply, fill #0

## 2022-04-03 MED ORDER — METFORMIN HCL ER 500 MG PO TB24
ORAL_TABLET | ORAL | 1 refills | Status: DC
  Filled 2022-04-03: qty 120, 30d supply, fill #0
  Filled 2022-08-26: qty 120, 30d supply, fill #1
  Filled 2023-01-01: qty 120, 30d supply, fill #2

## 2022-04-03 MED ORDER — TRULICITY 4.5 MG/0.5ML ~~LOC~~ SOAJ
SUBCUTANEOUS | 1 refills | Status: DC
  Filled 2022-04-03: qty 2, 28d supply, fill #0
  Filled 2022-05-03: qty 2, 28d supply, fill #1
  Filled 2022-05-30: qty 2, 28d supply, fill #2
  Filled 2022-07-30: qty 2, 28d supply, fill #3
  Filled 2022-08-26: qty 2, 28d supply, fill #4
  Filled 2022-09-26: qty 2, 28d supply, fill #5

## 2022-04-03 MED ORDER — TRIAMTERENE-HCTZ 37.5-25 MG PO TABS
1.0000 | ORAL_TABLET | Freq: Every day | ORAL | 3 refills | Status: DC
  Filled 2022-04-03: qty 30, 30d supply, fill #0
  Filled 2023-01-01: qty 90, 90d supply, fill #0

## 2022-04-08 ENCOUNTER — Other Ambulatory Visit: Payer: Self-pay

## 2022-04-08 MED ORDER — LEVOTHYROXINE SODIUM 137 MCG PO TABS
ORAL_TABLET | ORAL | 3 refills | Status: DC
  Filled 2022-04-08 – 2022-05-30 (×2): qty 30, 30d supply, fill #0
  Filled 2022-07-30: qty 30, 30d supply, fill #1
  Filled 2022-09-02: qty 30, 30d supply, fill #2
  Filled 2022-09-26: qty 30, 30d supply, fill #3
  Filled 2022-10-24: qty 30, 30d supply, fill #4
  Filled 2022-12-03: qty 30, 30d supply, fill #5
  Filled 2023-01-01: qty 30, 30d supply, fill #6

## 2022-04-26 ENCOUNTER — Other Ambulatory Visit: Payer: Self-pay

## 2022-05-03 ENCOUNTER — Other Ambulatory Visit: Payer: Self-pay

## 2022-05-09 ENCOUNTER — Other Ambulatory Visit: Payer: Self-pay

## 2022-05-30 ENCOUNTER — Other Ambulatory Visit: Payer: Self-pay

## 2022-05-31 ENCOUNTER — Other Ambulatory Visit: Payer: Self-pay

## 2022-07-04 ENCOUNTER — Other Ambulatory Visit: Payer: Self-pay

## 2022-07-04 ENCOUNTER — Other Ambulatory Visit: Payer: Self-pay | Admitting: Internal Medicine

## 2022-07-04 DIAGNOSIS — F419 Anxiety disorder, unspecified: Secondary | ICD-10-CM

## 2022-07-04 MED FILL — Alprazolam Tab 1 MG: ORAL | 30 days supply | Qty: 30 | Fill #0 | Status: AC

## 2022-07-05 ENCOUNTER — Other Ambulatory Visit: Payer: Self-pay

## 2022-07-30 ENCOUNTER — Other Ambulatory Visit: Payer: Self-pay

## 2022-07-30 MED FILL — Alprazolam Tab 1 MG: ORAL | 30 days supply | Qty: 30 | Fill #1 | Status: CN

## 2022-08-02 ENCOUNTER — Other Ambulatory Visit: Payer: Self-pay

## 2022-08-02 MED FILL — Alprazolam Tab 1 MG: ORAL | 30 days supply | Qty: 30 | Fill #1 | Status: AC

## 2022-08-26 ENCOUNTER — Other Ambulatory Visit: Payer: Self-pay

## 2022-09-02 ENCOUNTER — Other Ambulatory Visit: Payer: Self-pay

## 2022-09-02 MED FILL — Alprazolam Tab 1 MG: ORAL | 30 days supply | Qty: 30 | Fill #2 | Status: AC

## 2022-09-26 ENCOUNTER — Other Ambulatory Visit: Payer: Self-pay

## 2022-10-07 ENCOUNTER — Other Ambulatory Visit: Payer: Self-pay

## 2022-10-07 MED FILL — Alprazolam Tab 1 MG: ORAL | 30 days supply | Qty: 30 | Fill #3 | Status: AC

## 2022-10-24 ENCOUNTER — Other Ambulatory Visit: Payer: Self-pay

## 2022-10-25 ENCOUNTER — Other Ambulatory Visit: Payer: Self-pay

## 2022-11-12 ENCOUNTER — Other Ambulatory Visit: Payer: Self-pay

## 2022-11-12 MED FILL — Alprazolam Tab 1 MG: ORAL | 30 days supply | Qty: 30 | Fill #4 | Status: AC

## 2022-12-03 ENCOUNTER — Other Ambulatory Visit: Payer: Self-pay

## 2022-12-05 ENCOUNTER — Other Ambulatory Visit: Payer: Self-pay

## 2022-12-06 ENCOUNTER — Other Ambulatory Visit: Payer: Self-pay

## 2022-12-06 MED ORDER — TRULICITY 4.5 MG/0.5ML ~~LOC~~ SOAJ
4.5000 mg | SUBCUTANEOUS | 1 refills | Status: DC
  Filled 2022-12-06: qty 2, 28d supply, fill #0
  Filled 2023-01-01: qty 2, 28d supply, fill #1

## 2022-12-10 ENCOUNTER — Other Ambulatory Visit: Payer: Self-pay

## 2022-12-13 ENCOUNTER — Other Ambulatory Visit: Payer: Self-pay

## 2022-12-13 MED FILL — Alprazolam Tab 1 MG: ORAL | 30 days supply | Qty: 30 | Fill #5 | Status: AC

## 2023-01-01 ENCOUNTER — Other Ambulatory Visit: Payer: Self-pay

## 2023-01-01 ENCOUNTER — Telehealth: Payer: Self-pay | Admitting: Internal Medicine

## 2023-01-01 NOTE — Telephone Encounter (Signed)
Prescription Request  01/01/2023  Is this a "Controlled Substance" medicine? yes  LOV: Visit date not found  What is the name of the medication or equipment? ALPRAZolam Duanne Moron) 1 MG tablet  Have you contacted your pharmacy to request a refill? Yes   Which pharmacy would you like this sent to?  [facility] [place] [address] Phone: [phone] Fax: [phone]    Patient notified that their request is being sent to the clinical staff for review and that they should receive a response within 2 business days.   Please advise at Mobile [phone] (mobile)

## 2023-01-02 ENCOUNTER — Other Ambulatory Visit: Payer: Self-pay | Admitting: Family

## 2023-01-02 DIAGNOSIS — F419 Anxiety disorder, unspecified: Secondary | ICD-10-CM

## 2023-01-02 MED ORDER — ALPRAZOLAM 1 MG PO TABS
1.0000 mg | ORAL_TABLET | Freq: Every day | ORAL | 1 refills | Status: DC | PRN
  Filled 2023-01-02: qty 30, fill #0
  Filled 2023-01-09: qty 30, 30d supply, fill #0

## 2023-01-03 ENCOUNTER — Other Ambulatory Visit: Payer: Self-pay

## 2023-01-09 ENCOUNTER — Other Ambulatory Visit: Payer: Self-pay

## 2023-01-21 ENCOUNTER — Ambulatory Visit (INDEPENDENT_AMBULATORY_CARE_PROVIDER_SITE_OTHER): Payer: Self-pay | Admitting: Nurse Practitioner

## 2023-01-21 ENCOUNTER — Other Ambulatory Visit: Payer: Self-pay

## 2023-01-21 ENCOUNTER — Encounter: Payer: Self-pay | Admitting: Nurse Practitioner

## 2023-01-21 VITALS — BP 118/80 | HR 99 | Temp 98.4°F | Ht 69.5 in | Wt 225.2 lb

## 2023-01-21 DIAGNOSIS — E119 Type 2 diabetes mellitus without complications: Secondary | ICD-10-CM

## 2023-01-21 DIAGNOSIS — M5442 Lumbago with sciatica, left side: Secondary | ICD-10-CM

## 2023-01-21 DIAGNOSIS — E785 Hyperlipidemia, unspecified: Secondary | ICD-10-CM

## 2023-01-21 DIAGNOSIS — C73 Malignant neoplasm of thyroid gland: Secondary | ICD-10-CM

## 2023-01-21 DIAGNOSIS — I152 Hypertension secondary to endocrine disorders: Secondary | ICD-10-CM

## 2023-01-21 DIAGNOSIS — E1159 Type 2 diabetes mellitus with other circulatory complications: Secondary | ICD-10-CM

## 2023-01-21 DIAGNOSIS — G8929 Other chronic pain: Secondary | ICD-10-CM

## 2023-01-21 DIAGNOSIS — F419 Anxiety disorder, unspecified: Secondary | ICD-10-CM

## 2023-01-21 DIAGNOSIS — F32A Depression, unspecified: Secondary | ICD-10-CM

## 2023-01-21 DIAGNOSIS — G4700 Insomnia, unspecified: Secondary | ICD-10-CM

## 2023-01-21 DIAGNOSIS — J309 Allergic rhinitis, unspecified: Secondary | ICD-10-CM

## 2023-01-21 DIAGNOSIS — Z1231 Encounter for screening mammogram for malignant neoplasm of breast: Secondary | ICD-10-CM

## 2023-01-21 MED ORDER — TRIAMTERENE-HCTZ 37.5-25 MG PO TABS
1.0000 | ORAL_TABLET | Freq: Every day | ORAL | 3 refills | Status: DC
  Filled 2023-01-21 – 2023-04-03 (×2): qty 90, 90d supply, fill #0
  Filled 2023-07-14: qty 40, 40d supply, fill #1
  Filled 2023-07-14: qty 50, 50d supply, fill #1
  Filled 2023-11-06: qty 90, 90d supply, fill #2

## 2023-01-21 MED ORDER — LEVOCETIRIZINE DIHYDROCHLORIDE 5 MG PO TABS
5.0000 mg | ORAL_TABLET | Freq: Every day | ORAL | 3 refills | Status: DC | PRN
  Filled 2023-01-21: qty 90, 90d supply, fill #0
  Filled 2023-04-03 – 2023-04-09 (×2): qty 90, 90d supply, fill #1
  Filled 2023-07-14: qty 74, 74d supply, fill #2
  Filled 2023-07-14: qty 16, 16d supply, fill #2

## 2023-01-21 MED ORDER — CYCLOBENZAPRINE HCL 5 MG PO TABS
5.0000 mg | ORAL_TABLET | Freq: Every evening | ORAL | 2 refills | Status: DC | PRN
  Filled 2023-01-21: qty 30, 30d supply, fill #0
  Filled 2023-03-11: qty 30, 30d supply, fill #1
  Filled 2023-04-09: qty 30, 30d supply, fill #2

## 2023-01-21 MED ORDER — ATORVASTATIN CALCIUM 10 MG PO TABS
5.0000 mg | ORAL_TABLET | Freq: Every day | ORAL | 3 refills | Status: DC
  Filled 2023-01-21: qty 45, 90d supply, fill #0
  Filled 2023-04-03 – 2023-04-09 (×2): qty 45, 90d supply, fill #1

## 2023-01-21 MED ORDER — LEVOTHYROXINE SODIUM 137 MCG PO TABS
137.0000 ug | ORAL_TABLET | Freq: Every day | ORAL | 3 refills | Status: DC
  Filled 2023-01-21: qty 90, 90d supply, fill #0

## 2023-01-21 MED ORDER — ALPRAZOLAM 1 MG PO TABS
1.0000 mg | ORAL_TABLET | Freq: Every day | ORAL | 2 refills | Status: DC | PRN
  Filled 2023-01-21 – 2023-02-10 (×2): qty 30, 30d supply, fill #0
  Filled 2023-03-11: qty 30, 30d supply, fill #1
  Filled 2023-04-08: qty 30, 30d supply, fill #2

## 2023-01-21 MED ORDER — LOSARTAN POTASSIUM 25 MG PO TABS
12.5000 mg | ORAL_TABLET | Freq: Every day | ORAL | 3 refills | Status: DC
  Filled 2023-01-21: qty 45, 90d supply, fill #0
  Filled 2023-04-09: qty 45, 90d supply, fill #1
  Filled 2023-07-14: qty 45, 90d supply, fill #2
  Filled 2023-11-06: qty 45, 90d supply, fill #3

## 2023-01-21 MED ORDER — TRAZODONE HCL 100 MG PO TABS
100.0000 mg | ORAL_TABLET | Freq: Every day | ORAL | 3 refills | Status: DC
  Filled 2023-01-21: qty 90, 90d supply, fill #0

## 2023-01-21 MED ORDER — TRULICITY 4.5 MG/0.5ML ~~LOC~~ SOAJ
4.5000 mg | SUBCUTANEOUS | 1 refills | Status: DC
  Filled 2023-01-21: qty 2, 28d supply, fill #0
  Filled 2023-03-11: qty 2, 28d supply, fill #1
  Filled 2023-04-03 – 2023-04-11 (×3): qty 2, 28d supply, fill #2

## 2023-01-21 MED ORDER — METFORMIN HCL ER 500 MG PO TB24
1000.0000 mg | ORAL_TABLET | Freq: Two times a day (BID) | ORAL | 1 refills | Status: DC
  Filled 2023-01-21: qty 360, 90d supply, fill #0

## 2023-01-21 NOTE — Assessment & Plan Note (Signed)
Chronic. Stable on Losartan and Maxzide. Continue. Refills sent.

## 2023-01-21 NOTE — Assessment & Plan Note (Signed)
Chronic. Well controlled on Metformin and Trulicity. Last A1c- 5.8. Continue. Refills sent.

## 2023-01-21 NOTE — Progress Notes (Signed)
Kerry Morrow, NP-C Phone: [phone]  Kerry Davis is a 57 y.o. female who presents today for transfer of care. She has no complaints or new concerns today. She is followed closely by [facility].  She is requesting refills on all of her medications. 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   HYPERTENSION Disease Monitoring: Blood pressure range- Not checking Chest pain- No      Dyspnea- No Medications: Compliance- Losartan and Maxzide Lightheadedness- No   Edema- No  Lab Results  Component Value Date   NA 140 02/23/2021   K 3.8 02/23/2021   CO2 28 02/23/2021   GLUCOSE 134 (H) 02/23/2021   BUN 13 02/23/2021   CREATININE 0.88 02/23/2021   CALCIUM 8.9 02/23/2021    DIABETES Disease Monitoring: Blood Sugar ranges- Not checking Polyuria/phagia/dipsia- No      Optho- Yes, last week Medications: Compliance- Metformin and Trulicity Hypoglycemic symptoms- No November 2023 A1c with [facility]- 5.8 Lab Results  Component Value Date   HGBA1C 6.9 11/23/2021     HYPERLIPIDEMIA Disease Monitoring: See symptoms for Hypertension Medications: Compliance- Lipitor Right upper quadrant pain- No  Muscle aches- No Lab Results  Component Value Date   CHOL 135 02/23/2021   HDL 50.30 02/23/2021   LDLCALC 64 02/23/2021   LDLDIRECT 74.0 09/10/2019   TRIG 102.0 02/23/2021   CHOLHDL 3 02/23/2021     Social History   Tobacco Use  Smoking Status Some Days  Smokeless Tobacco Never  Tobacco Comments   1/2 ppd     Current Outpatient Medications on File Prior to Visit  Medication Sig Dispense Refill   Blood Glucose Monitoring Suppl (GLUCOCOM BLOOD GLUCOSE MONITOR) DEVI Ok to fill per pt's insurance formulary     FREESTYLE LITE test strip   3   ipratropium (ATROVENT) 0.06 % nasal spray Place 2 sprays into both nostrils 3  (three) times daily. 15 mL 12   Lancets (FREESTYLE) lancets   3   albuterol (VENTOLIN HFA) 108 (90 Base) MCG/ACT inhaler INHALE 1-2 PUFFS INTO THE LUNGS EVERY 4 HOURS AS NEEDED FOR WHEEZING OR SHORTNESS OF BREATH. 8.5 g 11   omeprazole (PRILOSEC) 40 MG capsule TAKE 1 CAPSULE BY MOUTH DAILY 90 capsule 3   No current facility-administered medications on file prior to visit.    ROS see history of present illness  Objective  Physical Exam Vitals:   01/21/23 1410  BP: 118/80  Pulse: 99  Temp: 98.4 F (36.9 C)  SpO2: 97%    BP Readings from Last 3 Encounters:  01/21/23 118/80  12/25/21 138/78  02/23/21 118/78   Wt Readings from Last 3 Encounters:  01/21/23 225 lb 3.2 oz (102.2 kg)  12/25/21 222 lb 6.4 oz (100.9 kg)  02/23/21 230 lb 3.2 oz (104.4 kg)    Physical Exam Constitutional:      General: She is not in acute distress.    Appearance: Normal appearance.  HENT:     Head: Normocephalic.  Cardiovascular:     Rate and Rhythm: Normal rate and regular rhythm.     Heart sounds: Normal heart sounds.  Pulmonary:     Effort: Pulmonary effort is normal.     Breath sounds: Normal breath sounds.  Musculoskeletal:        General: No swelling. Normal range of motion.  Skin:  General: Skin is warm and dry.  Neurological:     General: No focal deficit present.     Mental Status: She is alert.  Psychiatric:        Mood and Affect: Mood normal.        Behavior: Behavior normal.    Assessment/Plan: Please see individual problem list.  Type 2 diabetes mellitus without complication, without long-term current use of insulin (HCC) Assessment & Plan: Chronic. Well controlled on Metformin and Trulicity. Last A1c- 5.8. Continue. Refills sent.   Orders: -     Trulicity; Inject 4.5 mg into the skin once a week.  Dispense: 6 mL; Refill: 1 -     metFORMIN HCl ER; Take 2 tablets (1,000 mg total) by mouth 2 (two) times daily.  Dispense: 360 tablet; Refill: 1  Hypertension  associated with diabetes ([place]) Assessment & Plan: Chronic. Stable on Losartan and Maxzide. Continue. Refills sent.   Orders: -     Losartan Potassium; Take 0.5 tablets (12.5 mg total) by mouth daily.  Dispense: 45 tablet; Refill: 3 -     Triamterene-HCTZ; Take 1 tablet by mouth daily.  Dispense: 90 tablet; Refill: 3  Hyperlipidemia, unspecified hyperlipidemia type Assessment & Plan: Chronic. Stable on Lipitor. Continue. Refills sent. LDL- 72.   Orders: -     Atorvastatin Calcium; Take 0.5 tablets (5 mg total) by mouth daily.  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 On Levothyroxine 137 mcg daily. Managed by [facility]. Follow up as scheduled.  Orders: -     Levothyroxine Sodium; Take 1 tablet (137 mcg total) by mouth daily.  Dispense: 90 tablet; Refill: 3  Anxiety and depression Assessment & Plan: Chronic. PHQ and GAD both 15. Patient declined referral to Psych. Denies SI/HI. Reports symptoms manageable with Xanax and Trazodone. Refills sent. Will monitor.   Orders: -     ALPRAZolam; Take 1 tablet (1 mg total) by mouth daily as needed.  Dispense: 30 tablet; Refill: 2  Chronic midline low back pain with left-sided sciatica Assessment & Plan: Will refill Flexeril 5 mg QHS PRN. Encouraged Ibuprofen, heating pad, and stretching. Advised to return if not improving.   Orders: -     Cyclobenzaprine HCl; Take 1 tablet (5 mg total) by mouth at bedtime as needed for muscle spasms.  Dispense: 30 tablet; Refill: 2  Insomnia, unspecified type Assessment & Plan: Chronic. Stable on Trazodone. Continue. Refills sent.   Orders: -     traZODone HCl; Take 1 tablet (100 mg total) by mouth at bedtime.  Dispense: 90 tablet; Refill: 3  Allergic rhinitis, unspecified seasonality, unspecified trigger -     Levocetirizine Dihydrochloride; Take 1 tablet (5 mg total) by mouth daily as needed for  allergies.  Dispense: 90 tablet; Refill: 3  Screening mammogram for breast cancer -     3D Screening Mammogram, Left and Right; Future   Return in about 6 months (around 07/24/2023) for Follow up.   Kerry Morrow, NP-C [place]

## 2023-01-21 NOTE — Assessment & Plan Note (Signed)
Has [place] and CT imaging upcoming. On Levothyroxine 137 mcg daily. Managed by [facility]. Follow up as scheduled.

## 2023-01-21 NOTE — Assessment & Plan Note (Signed)
Chronic. Stable on Trazodone. Continue. Refills sent.

## 2023-01-21 NOTE — Assessment & Plan Note (Signed)
Chronic. PHQ and GAD both 15. Patient declined referral to Psych. Denies SI/HI. Reports symptoms manageable with Xanax and Trazodone. Refills sent. Will monitor.

## 2023-01-21 NOTE — Assessment & Plan Note (Signed)
Will refill Flexeril 5 mg QHS PRN. Encouraged Ibuprofen, heating pad, and stretching. Advised to return if not improving.

## 2023-01-21 NOTE — Assessment & Plan Note (Signed)
Chronic. Stable on Lipitor. Continue. Refills sent. LDL- 72.

## 2023-01-22 ENCOUNTER — Other Ambulatory Visit: Payer: Self-pay

## 2023-01-27 ENCOUNTER — Other Ambulatory Visit: Payer: Self-pay

## 2023-02-05 DIAGNOSIS — Z9089 Acquired absence of other organs: Secondary | ICD-10-CM | POA: Diagnosis not present

## 2023-02-05 DIAGNOSIS — R918 Other nonspecific abnormal finding of lung field: Secondary | ICD-10-CM | POA: Diagnosis not present

## 2023-02-05 DIAGNOSIS — C73 Malignant neoplasm of thyroid gland: Secondary | ICD-10-CM | POA: Diagnosis not present

## 2023-02-10 ENCOUNTER — Other Ambulatory Visit: Payer: Self-pay

## 2023-02-10 ENCOUNTER — Ambulatory Visit
Admission: RE | Admit: 2023-02-10 | Discharge: 2023-02-10 | Disposition: A | Discharge status: Home / Self Care | Payer: BC Managed Care – PPO | Source: Ambulatory Visit | Attending: Nurse Practitioner | Admitting: Nurse Practitioner

## 2023-02-10 DIAGNOSIS — Z1231 Encounter for screening mammogram for malignant neoplasm of breast: Secondary | ICD-10-CM | POA: Diagnosis not present

## 2023-03-04 ENCOUNTER — Encounter: Payer: 59 | Admitting: Nurse Practitioner

## 2023-03-11 ENCOUNTER — Other Ambulatory Visit: Payer: Self-pay

## 2023-04-03 ENCOUNTER — Other Ambulatory Visit: Payer: Self-pay

## 2023-04-07 ENCOUNTER — Telehealth: Payer: Self-pay | Admitting: Pharmacy Technician

## 2023-04-07 ENCOUNTER — Other Ambulatory Visit: Payer: Self-pay

## 2023-04-07 NOTE — Telephone Encounter (Signed)
Patient Advocate Encounter   Received notification that prior authorization for Trulicity 4.5MG /0.5ML pen-injectors is required.   PA submitted on 04/07/2023 [facility] [employer] Electronic PA Form Status is pending

## 2023-04-08 ENCOUNTER — Other Ambulatory Visit: Payer: Self-pay

## 2023-04-09 ENCOUNTER — Other Ambulatory Visit: Payer: Self-pay

## 2023-04-09 DIAGNOSIS — E559 Vitamin D deficiency, unspecified: Secondary | ICD-10-CM | POA: Diagnosis not present

## 2023-04-09 DIAGNOSIS — R918 Other nonspecific abnormal finding of lung field: Secondary | ICD-10-CM | POA: Diagnosis not present

## 2023-04-09 DIAGNOSIS — R768 Other specified abnormal immunological findings in serum: Secondary | ICD-10-CM | POA: Diagnosis not present

## 2023-04-09 DIAGNOSIS — C73 Malignant neoplasm of thyroid gland: Secondary | ICD-10-CM | POA: Diagnosis not present

## 2023-04-09 DIAGNOSIS — E119 Type 2 diabetes mellitus without complications: Secondary | ICD-10-CM | POA: Diagnosis not present

## 2023-04-09 MED ORDER — TRULICITY 4.5 MG/0.5ML ~~LOC~~ SOAJ
SUBCUTANEOUS | 11 refills | Status: DC
  Filled 2023-04-11: qty 2, 28d supply, fill #0

## 2023-04-09 MED ORDER — LEVOTHYROXINE SODIUM 137 MCG PO TABS
137.0000 ug | ORAL_TABLET | Freq: Every day | ORAL | 3 refills | Status: DC
  Filled 2023-04-09 – 2023-04-15 (×2): qty 90, 90d supply, fill #0
  Filled 2023-08-11: qty 90, 90d supply, fill #1
  Filled 2023-11-06: qty 90, 90d supply, fill #2
  Filled 2024-02-03: qty 90, 90d supply, fill #3

## 2023-04-09 MED ORDER — PROPRANOLOL HCL ER 60 MG PO CP24
60.0000 mg | ORAL_CAPSULE | Freq: Every day | ORAL | 3 refills | Status: DC
  Filled 2023-04-09: qty 90, 90d supply, fill #0
  Filled 2023-07-14: qty 90, 90d supply, fill #1

## 2023-04-11 ENCOUNTER — Other Ambulatory Visit: Payer: Self-pay

## 2023-04-15 ENCOUNTER — Other Ambulatory Visit: Payer: Self-pay

## 2023-04-22 ENCOUNTER — Other Ambulatory Visit: Payer: Self-pay

## 2023-05-12 ENCOUNTER — Other Ambulatory Visit: Payer: Self-pay

## 2023-05-12 ENCOUNTER — Other Ambulatory Visit: Payer: Self-pay | Admitting: Nurse Practitioner

## 2023-05-12 DIAGNOSIS — F419 Anxiety disorder, unspecified: Secondary | ICD-10-CM

## 2023-05-13 ENCOUNTER — Other Ambulatory Visit: Payer: Self-pay

## 2023-05-14 ENCOUNTER — Telehealth: Payer: Self-pay

## 2023-05-14 ENCOUNTER — Other Ambulatory Visit: Payer: Self-pay

## 2023-05-14 MED FILL — Alprazolam Tab 1 MG: ORAL | 30 days supply | Qty: 30 | Fill #0 | Status: AC

## 2023-05-14 NOTE — Telephone Encounter (Signed)
Prescription Request  05/14/2023  LOV: 01/21/2023  What is the name of the medication or equipment? ALPRAZolam   Have you contacted your pharmacy to request a refill? Yes   Which pharmacy would you like this sent to?  [facility] [address] [place] [address] Phone: [phone] Fax: [phone]    Patient notified that their request is being sent to the clinical staff for review and that they should receive a response within 2 business days.   Please advise at Mobile [phone] (mobile)

## 2023-05-14 NOTE — Telephone Encounter (Signed)
Pt informed to stop by and sign, contract has been placed upfront in designated pick up area

## 2023-05-14 NOTE — Telephone Encounter (Signed)
Called pt in regards to refill request for [facility] pt didn't get to sign a non-opioid contract. She has been informed that she can come by the office to sign the contract. I told pt that the form will be placed up front where she can sign and it will be uploaded to her chart.    Form has been placed in the designated pick up area

## 2023-05-15 ENCOUNTER — Other Ambulatory Visit: Payer: Self-pay

## 2023-06-06 ENCOUNTER — Other Ambulatory Visit: Payer: Self-pay

## 2023-06-06 MED FILL — Alprazolam Tab 1 MG: ORAL | 30 days supply | Qty: 30 | Fill #1 | Status: CN

## 2023-06-09 ENCOUNTER — Other Ambulatory Visit: Payer: Self-pay

## 2023-06-09 MED FILL — Alprazolam Tab 1 MG: ORAL | 30 days supply | Qty: 30 | Fill #1 | Status: CN

## 2023-06-11 ENCOUNTER — Other Ambulatory Visit: Payer: Self-pay

## 2023-06-11 MED FILL — Alprazolam Tab 1 MG: ORAL | 30 days supply | Qty: 30 | Fill #1 | Status: CN

## 2023-06-11 MED FILL — Alprazolam Tab 1 MG: ORAL | 30 days supply | Qty: 30 | Fill #1 | Status: AC

## 2023-06-20 ENCOUNTER — Other Ambulatory Visit (HOSPITAL_COMMUNITY): Payer: Self-pay

## 2023-06-20 NOTE — Telephone Encounter (Signed)
Pharmacy Patient Advocate Encounter  Received notification from [facility]  that Prior Authorization for  Trulicity 4.5MG /0.5ML pen-injectors  has been APPROVED from 04/14/2023 to 04/13/2024. Ran test claim, Copay is $30.00  PA #/Case ID/Reference #: [id number]

## 2023-07-14 ENCOUNTER — Other Ambulatory Visit: Payer: Self-pay

## 2023-07-14 MED FILL — Alprazolam Tab 1 MG: ORAL | 30 days supply | Qty: 30 | Fill #2 | Status: AC

## 2023-08-11 ENCOUNTER — Other Ambulatory Visit: Payer: Self-pay | Admitting: Nurse Practitioner

## 2023-08-11 ENCOUNTER — Other Ambulatory Visit: Payer: Self-pay

## 2023-08-11 DIAGNOSIS — F32A Depression, unspecified: Secondary | ICD-10-CM

## 2023-08-12 ENCOUNTER — Other Ambulatory Visit: Payer: Self-pay | Admitting: Nurse Practitioner

## 2023-08-12 ENCOUNTER — Other Ambulatory Visit: Payer: Self-pay

## 2023-08-12 DIAGNOSIS — F32A Depression, unspecified: Secondary | ICD-10-CM

## 2023-08-13 ENCOUNTER — Other Ambulatory Visit: Payer: Self-pay

## 2023-08-13 MED FILL — Alprazolam Tab 1 MG: ORAL | 30 days supply | Qty: 30 | Fill #0 | Status: AC

## 2023-08-13 NOTE — Progress Notes (Unsigned)
Bethanie Dicker, NP-C Phone: [phone]  LACHELE GOPALAKRISHNAN is a 57 y.o. female who presents today for cough.  Respiratory illness:  Cough- ***  Congestion- ***   Sinus- ***   Chest- ***  Post nasal drip- ***  Sore throat- ***  Shortness of breath- ***  Fever- ***  Fatigue/Myalgia- *** Headache- *** Nausea/Vomiting- *** Taste disturbance- ***  Smell disturbance- ***  Covid exposure- ***  Covid vaccination- ***  Flu vaccination- ***  Medications- ***   Social History   Tobacco Use  Smoking Status Some Days  Smokeless Tobacco Never  Tobacco Comments   1/2 ppd     Current Outpatient Medications on File Prior to Visit  Medication Sig Dispense Refill   albuterol (VENTOLIN HFA) 108 (90 Base) MCG/ACT inhaler INHALE 1-2 PUFFS INTO THE LUNGS EVERY 4 HOURS AS NEEDED FOR WHEEZING OR SHORTNESS OF BREATH. 8.5 g 11   ALPRAZolam (XANAX) 1 MG tablet Take 1 tablet (1 mg total) by mouth daily as needed. 30 tablet 5   atorvastatin (LIPITOR) 10 MG tablet Take 0.5 tablets (5 mg total) by mouth daily. 45 tablet 3   Blood Glucose Monitoring Suppl (GLUCOCOM BLOOD GLUCOSE MONITOR) DEVI Ok to fill per pt's insurance formulary     cyclobenzaprine (FLEXERIL) 5 MG tablet Take 1 tablet (5 mg total) by mouth at bedtime as needed for muscle spasms. 30 tablet 2   Dulaglutide (TRULICITY) 4.5 MG/0.5ML SOPN Inject 4.5 mg into the skin once a week. 6 mL 1   Dulaglutide (TRULICITY) 4.5 MG/0.5ML SOPN Please provide 4 PENS (8 mL) which is equal to a 30 day supply and 11  refills. 2 mL 11   FREESTYLE LITE test strip   3   ipratropium (ATROVENT) 0.06 % nasal spray Place 2 sprays into both nostrils 3 (three) times daily. 15 mL 12   Lancets (FREESTYLE) lancets   3   levocetirizine (XYZAL) 5 MG tablet Take 1 tablet (5 mg total) by mouth daily as needed for allergies. 90 tablet 3   levothyroxine (SYNTHROID) 137 MCG tablet Take 1 tablet (137 mcg total) by mouth daily. 90 tablet 3   levothyroxine (SYNTHROID) 137 MCG  tablet Take 1 tablet (137 mcg total) by mouth daily. 90 tablet 3   losartan (COZAAR) 25 MG tablet Take 0.5 tablets (12.5 mg total) by mouth daily. 45 tablet 3   metFORMIN (GLUCOPHAGE-XR) 500 MG 24 hr tablet Take 2 tablets (1,000 mg total) by mouth 2 (two) times daily. 360 tablet 1   omeprazole (PRILOSEC) 40 MG capsule TAKE 1 CAPSULE BY MOUTH DAILY 90 capsule 3   propranolol ER (INDERAL LA) 60 MG 24 hr capsule Take 1 capsule (60 mg total) by mouth daily. 90 capsule 3   traZODone (DESYREL) 100 MG tablet Take 1 tablet (100 mg total) by mouth at bedtime. 90 tablet 3   triamterene-hydrochlorothiazide (MAXZIDE-25) 37.5-25 MG tablet Take 1 tablet by mouth daily. 90 tablet 3   No current facility-administered medications on file prior to visit.     ROS see history of present illness  Objective  Physical Exam There were no vitals filed for this visit.  BP Readings from Last 3 Encounters:  01/21/23 118/80  12/25/21 138/78  02/23/21 118/78   Wt Readings from Last 3 Encounters:  01/21/23 225 lb 3.2 oz (102.2 kg)  12/25/21 222 lb 6.4 oz (100.9 kg)  02/23/21 230 lb 3.2 oz (104.4 kg)    Physical Exam   Assessment/Plan: Please see individual problem list.  There  are no diagnoses linked to this encounter.   Health Maintenance: ***  No follow-ups on file.   Bethanie Dicker, NP-C [facility] - [employer]

## 2023-08-14 ENCOUNTER — Ambulatory Visit: Payer: BC Managed Care – PPO | Admitting: Nurse Practitioner

## 2023-08-14 ENCOUNTER — Other Ambulatory Visit: Payer: Self-pay

## 2023-08-14 ENCOUNTER — Encounter: Payer: Self-pay | Admitting: Nurse Practitioner

## 2023-08-14 VITALS — BP 100/80 | HR 83 | Temp 97.6°F | Ht 69.5 in | Wt 232.2 lb

## 2023-08-14 DIAGNOSIS — R051 Acute cough: Secondary | ICD-10-CM | POA: Diagnosis not present

## 2023-08-14 DIAGNOSIS — Z1211 Encounter for screening for malignant neoplasm of colon: Secondary | ICD-10-CM

## 2023-08-14 DIAGNOSIS — M5442 Lumbago with sciatica, left side: Secondary | ICD-10-CM

## 2023-08-14 DIAGNOSIS — G8929 Other chronic pain: Secondary | ICD-10-CM

## 2023-08-14 DIAGNOSIS — J4520 Mild intermittent asthma, uncomplicated: Secondary | ICD-10-CM | POA: Diagnosis not present

## 2023-08-14 MED ORDER — CYCLOBENZAPRINE HCL 5 MG PO TABS
5.0000 mg | ORAL_TABLET | Freq: Every evening | ORAL | 2 refills | Status: DC | PRN
Start: 2023-08-14 — End: 2024-02-24

## 2023-08-14 MED ORDER — ALBUTEROL SULFATE HFA 108 (90 BASE) MCG/ACT IN AERS
INHALATION_SPRAY | RESPIRATORY_TRACT | 0 refills | Status: DC
Start: 2023-08-14 — End: 2024-10-05

## 2023-08-14 MED ORDER — HYDROCOD POLI-CHLORPHE POLI ER 10-8 MG/5ML PO SUER
5.0000 mL | Freq: Every evening | ORAL | 0 refills | Status: DC | PRN
Start: 2023-08-14 — End: 2024-02-24

## 2023-08-14 NOTE — Assessment & Plan Note (Addendum)
Chronic. Stable. Rarely having to use inhaler. Refills sent on albuterol.

## 2023-08-14 NOTE — Assessment & Plan Note (Addendum)
Cough x 2 weeks. Will treat with Tussionex at bedtime. Offered Prednisone, patient politely declined. Refill sent on albuterol inhaler. Encouraged adequate fluid intake. She will contact if her cough persists or her symptoms are worsening/changing.

## 2023-08-14 NOTE — Assessment & Plan Note (Signed)
Chronic issue. Will refill Flexeril 5 mg QHS PRN. Encouraged Ibuprofen, heating pad, and stretching.

## 2023-08-27 ENCOUNTER — Encounter: Payer: Self-pay | Admitting: *Deleted

## 2023-09-15 ENCOUNTER — Other Ambulatory Visit: Payer: Self-pay

## 2023-09-15 MED FILL — Alprazolam Tab 1 MG: ORAL | 30 days supply | Qty: 30 | Fill #1 | Status: AC

## 2023-09-24 ENCOUNTER — Telehealth: Payer: Self-pay | Admitting: Physician Assistant

## 2023-09-24 ENCOUNTER — Telehealth: Payer: Self-pay

## 2023-09-24 NOTE — Telephone Encounter (Signed)
Pt requesting call back to schedule colonoscopy.

## 2023-09-24 NOTE — Telephone Encounter (Signed)
PT requesting call back to scchedule colonoscopy

## 2023-09-25 NOTE — Telephone Encounter (Signed)
Message left for patient to return my call.  

## 2023-10-01 NOTE — Telephone Encounter (Signed)
Spoken to patient. She works night shift so it would be best to call her early in the morning or late in the afternoon. Patient request for me to call her around 8:15 am on Thursday, 10/02/2023

## 2023-10-02 ENCOUNTER — Other Ambulatory Visit: Payer: Self-pay | Admitting: *Deleted

## 2023-10-02 ENCOUNTER — Telehealth: Payer: Self-pay | Admitting: *Deleted

## 2023-10-02 DIAGNOSIS — Z860100 Personal history of colon polyps, unspecified: Secondary | ICD-10-CM

## 2023-10-02 DIAGNOSIS — Z800 Family history of malignant neoplasm of digestive organs: Secondary | ICD-10-CM

## 2023-10-02 MED ORDER — NA SULFATE-K SULFATE-MG SULF 17.5-3.13-1.6 GM/177ML PO SOLN
1.0000 | Freq: Once | ORAL | 0 refills | Status: AC
Start: 2023-10-02 — End: 2023-10-02

## 2023-10-02 NOTE — Telephone Encounter (Signed)
ERROR

## 2023-10-02 NOTE — Telephone Encounter (Signed)
Colonoscopy schedule for 11/05/2023 with Dr Servando Snare at [facility]

## 2023-10-02 NOTE — Telephone Encounter (Signed)
Gastroenterology Pre-Procedure Review  Request Date: 11/05/2023 Requesting Physician: Dr. Servando Snare  PATIENT REVIEW QUESTIONS: The patient responded to the following health history questions as indicated:    1. Are you having any GI issues? no 2. Do you have a personal history of Polyps? yes (last colonoscopy was 09/15/2018 with Dr Servando Snare, no polyps at this procedure) 3. Do you have a family history of Colon Cancer or Polyps? yes (mother had colon cancer) 4. Diabetes Mellitus? yes (metformin and Trulicity) 5. Joint replacements in the past 12 months?no 6. Major health problems in the past 3 months?no 7. Any artificial heart valves, MVP, or defibrillator?no    MEDICATIONS & ALLERGIES:    Patient reports the following regarding taking any anticoagulation/antiplatelet therapy:   Plavix, Coumadin, Eliquis, Xarelto, Lovenox, Pradaxa, Brilinta, or Effient? no Aspirin? no  Patient confirms/reports the following medications:  Current Outpatient Medications  Medication Sig Dispense Refill   albuterol (VENTOLIN HFA) 108 (90 Base) MCG/ACT inhaler INHALE 1-2 PUFFS INTO THE LUNGS EVERY 4 HOURS AS NEEDED FOR WHEEZING OR SHORTNESS OF BREATH. 8.5 g 0   ALPRAZolam (XANAX) 1 MG tablet Take 1 tablet (1 mg total) by mouth daily as needed. 30 tablet 5   atorvastatin (LIPITOR) 10 MG tablet Take 0.5 tablets (5 mg total) by mouth daily. 45 tablet 3   Blood Glucose Monitoring Suppl (GLUCOCOM BLOOD GLUCOSE MONITOR) DEVI Ok to fill per pt's insurance formulary     chlorpheniramine-HYDROcodone (TUSSIONEX) 10-8 MG/5ML Take 5 mLs by mouth at bedtime as needed for cough. 115 mL 0   cyclobenzaprine (FLEXERIL) 5 MG tablet Take 1 tablet (5 mg total) by mouth at bedtime as needed for muscle spasms. 30 tablet 2   Dulaglutide (TRULICITY) 4.5 MG/0.5ML SOPN Inject 4.5 mg into the skin once a week. 6 mL 1   Dulaglutide (TRULICITY) 4.5 MG/0.5ML SOPN Please provide 4 PENS (8 mL) which is equal to a 30 day supply and 11  refills. 2 mL  11   FREESTYLE LITE test strip   3   ipratropium (ATROVENT) 0.06 % nasal spray Place 2 sprays into both nostrils 3 (three) times daily. 15 mL 12   Lancets (FREESTYLE) lancets   3   levocetirizine (XYZAL) 5 MG tablet Take 1 tablet (5 mg total) by mouth daily as needed for allergies. 90 tablet 3   levothyroxine (SYNTHROID) 137 MCG tablet Take 1 tablet (137 mcg total) by mouth daily. 90 tablet 3   levothyroxine (SYNTHROID) 137 MCG tablet Take 1 tablet (137 mcg total) by mouth daily. 90 tablet 3   losartan (COZAAR) 25 MG tablet Take 0.5 tablets (12.5 mg total) by mouth daily. 45 tablet 3   metFORMIN (GLUCOPHAGE-XR) 500 MG 24 hr tablet Take 2 tablets (1,000 mg total) by mouth 2 (two) times daily. 360 tablet 1   omeprazole (PRILOSEC) 40 MG capsule TAKE 1 CAPSULE BY MOUTH DAILY 90 capsule 3   propranolol ER (INDERAL LA) 60 MG 24 hr capsule Take 1 capsule (60 mg total) by mouth daily. (Patient not taking: Reported on 08/14/2023) 90 capsule 3   traZODone (DESYREL) 100 MG tablet Take 1 tablet (100 mg total) by mouth at bedtime. 90 tablet 3   triamterene-hydrochlorothiazide (MAXZIDE-25) 37.5-25 MG tablet Take 1 tablet by mouth daily. 90 tablet 3   No current facility-administered medications for this visit.    Patient confirms/reports the following allergies:  Allergies  Allergen Reactions   Propranolol Hcl Cough    No orders of the defined types were placed in  this encounter.   AUTHORIZATION INFORMATION Primary Insurance: 1D#: Group #:  Secondary Insurance: 1D#: Group #:  SCHEDULE INFORMATION: Date: 11/05/2023 Time: Location:  [facility]

## 2023-10-14 ENCOUNTER — Other Ambulatory Visit: Payer: Self-pay

## 2023-10-14 MED FILL — Alprazolam Tab 1 MG: ORAL | 30 days supply | Qty: 30 | Fill #2 | Status: AC

## 2023-10-15 DIAGNOSIS — R918 Other nonspecific abnormal finding of lung field: Secondary | ICD-10-CM | POA: Diagnosis not present

## 2023-10-15 DIAGNOSIS — C73 Malignant neoplasm of thyroid gland: Secondary | ICD-10-CM | POA: Diagnosis not present

## 2023-10-15 DIAGNOSIS — E119 Type 2 diabetes mellitus without complications: Secondary | ICD-10-CM | POA: Diagnosis not present

## 2023-10-15 DIAGNOSIS — E559 Vitamin D deficiency, unspecified: Secondary | ICD-10-CM | POA: Diagnosis not present

## 2023-10-15 LAB — HEMOGLOBIN A1C: Hemoglobin A1C: 6.3

## 2023-11-05 ENCOUNTER — Ambulatory Visit: Payer: BC Managed Care – PPO | Admitting: General Practice

## 2023-11-05 ENCOUNTER — Encounter: Payer: Self-pay | Admitting: Gastroenterology

## 2023-11-05 ENCOUNTER — Ambulatory Visit
Admission: RE | Admit: 2023-11-05 | Discharge: 2023-11-05 | Disposition: A | Discharge status: Home / Self Care | Payer: BC Managed Care – PPO | Attending: Gastroenterology | Admitting: Gastroenterology

## 2023-11-05 ENCOUNTER — Encounter
Admission: RE | Disposition: A | Discharge status: Home / Self Care | Payer: Self-pay | Source: Home / Self Care | Attending: Gastroenterology

## 2023-11-05 ENCOUNTER — Other Ambulatory Visit: Payer: Self-pay

## 2023-11-05 DIAGNOSIS — Z1211 Encounter for screening for malignant neoplasm of colon: Secondary | ICD-10-CM | POA: Insufficient documentation

## 2023-11-05 DIAGNOSIS — D125 Benign neoplasm of sigmoid colon: Secondary | ICD-10-CM | POA: Insufficient documentation

## 2023-11-05 DIAGNOSIS — K635 Polyp of colon: Secondary | ICD-10-CM | POA: Diagnosis not present

## 2023-11-05 DIAGNOSIS — I10 Essential (primary) hypertension: Secondary | ICD-10-CM | POA: Insufficient documentation

## 2023-11-05 DIAGNOSIS — F17210 Nicotine dependence, cigarettes, uncomplicated: Secondary | ICD-10-CM | POA: Insufficient documentation

## 2023-11-05 DIAGNOSIS — G4730 Sleep apnea, unspecified: Secondary | ICD-10-CM | POA: Insufficient documentation

## 2023-11-05 DIAGNOSIS — D123 Benign neoplasm of transverse colon: Secondary | ICD-10-CM | POA: Diagnosis not present

## 2023-11-05 DIAGNOSIS — K219 Gastro-esophageal reflux disease without esophagitis: Secondary | ICD-10-CM | POA: Diagnosis not present

## 2023-11-05 DIAGNOSIS — Z860100 Personal history of colon polyps, unspecified: Secondary | ICD-10-CM

## 2023-11-05 DIAGNOSIS — Z800 Family history of malignant neoplasm of digestive organs: Secondary | ICD-10-CM

## 2023-11-05 HISTORY — PX: POLYPECTOMY: SHX5525

## 2023-11-05 HISTORY — PX: COLONOSCOPY WITH PROPOFOL: SHX5780

## 2023-11-05 SURGERY — COLONOSCOPY WITH PROPOFOL
Anesthesia: General

## 2023-11-05 MED ORDER — PHENYLEPHRINE 80 MCG/ML (10ML) SYRINGE FOR IV PUSH (FOR BLOOD PRESSURE SUPPORT)
PREFILLED_SYRINGE | INTRAVENOUS | Status: DC | PRN
  Administered 2023-11-05: 160 ug via INTRAVENOUS

## 2023-11-05 MED ORDER — LIDOCAINE HCL (CARDIAC) PF 100 MG/5ML IV SOSY
PREFILLED_SYRINGE | INTRAVENOUS | Status: DC | PRN
  Administered 2023-11-05: 50 mg via INTRAVENOUS

## 2023-11-05 MED ORDER — DEXMEDETOMIDINE HCL IN NACL 80 MCG/20ML IV SOLN
INTRAVENOUS | Status: DC | PRN
  Administered 2023-11-05: 8 ug via INTRAVENOUS

## 2023-11-05 MED ORDER — SODIUM CHLORIDE 0.9 % IV SOLN
INTRAVENOUS | Status: DC
Start: 2023-11-05 — End: 2023-11-05

## 2023-11-05 MED ORDER — PROPOFOL 1000 MG/100ML IV EMUL
INTRAVENOUS | Status: AC
  Filled 2023-11-05: qty 100

## 2023-11-05 MED ORDER — PROPOFOL 10 MG/ML IV BOLUS
INTRAVENOUS | Status: DC | PRN
  Administered 2023-11-05: 100 mg via INTRAVENOUS
  Administered 2023-11-05: 150 ug/kg/min via INTRAVENOUS

## 2023-11-05 NOTE — Anesthesia Postprocedure Evaluation (Signed)
Anesthesia Post Note  Patient: Kerry Davis  Procedure(s) Performed: COLONOSCOPY WITH PROPOFOL POLYPECTOMY  Patient location during evaluation: Endoscopy Anesthesia Type: General Level of consciousness: awake and alert Pain management: pain level controlled Vital Signs Assessment: post-procedure vital signs reviewed and stable Respiratory status: spontaneous breathing, nonlabored ventilation, respiratory function stable and patient connected to nasal cannula oxygen Cardiovascular status: blood pressure returned to baseline and stable Postop Assessment: no apparent nausea or vomiting Anesthetic complications: no  No notable events documented.   Last Vitals:  Vitals:   11/05/23 0914 11/05/23 0924  BP: 99/82 105/70  Pulse: 64   Resp: 14   Temp:    SpO2: 100%     Last Pain:  Vitals:   11/05/23 0924  TempSrc:   PainSc: 0-No pain                 Stephanie Coup

## 2023-11-05 NOTE — Transfer of Care (Signed)
Immediate Anesthesia Transfer of Care Note  Patient: Kerry Davis  Procedure(s) Performed: COLONOSCOPY WITH PROPOFOL  Patient Location: Endoscopy Unit  Anesthesia Type:General  Level of Consciousness: awake, alert , and oriented  Airway & Oxygen Therapy: Patient Spontanous Breathing  Post-op Assessment: Report given to RN and Post -op Vital signs reviewed and stable  Post vital signs: Reviewed and stable  Last Vitals:  Vitals Value Taken Time  BP 95/66 11/05/23 0904  Temp 35.8 C 11/05/23 0904  Pulse    Resp 17 11/05/23 0904  SpO2 100 % 11/05/23 0904    Last Pain:  Vitals:   11/05/23 0904  TempSrc: Temporal  PainSc:          Complications: No notable events documented.

## 2023-11-05 NOTE — Anesthesia Preprocedure Evaluation (Signed)
Anesthesia Evaluation  Patient identified by MRN, date of birth, ID band Patient awake    Reviewed: Allergy & Precautions, NPO status , Patient's Chart, lab work & pertinent test results  Airway Mallampati: III  TM Distance: >3 FB Neck ROM: full    Dental  (+) Dental Advidsory Given   Pulmonary neg pulmonary ROS, sleep apnea and Continuous Positive Airway Pressure Ventilation , Current Smoker   Pulmonary exam normal        Cardiovascular hypertension, On Medications negative cardio ROS Normal cardiovascular exam     Neuro/Psych  PSYCHIATRIC DISORDERS Anxiety Depression    negative neurological ROS  negative psych ROS   GI/Hepatic negative GI ROS, Neg liver ROS,GERD  Medicated,,  Endo/Other  negative endocrine ROSdiabetesHypothyroidism    Renal/GU negative Renal ROS  negative genitourinary   Musculoskeletal   Abdominal   Peds  Hematology negative hematology ROS (+)   Anesthesia Other Findings Past Medical History: No date: Anxiety No date: Asthma No date: Cancer [facility])     Comment:  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  No date: Colon polyps No date: Depression No date: Diabetes mellitus without complication (HCC) No date: GERD (gastroesophageal reflux disease) No date: Herpes     Comment:  1+2 No date: Hyperlipidemia No date: Hypertension No date: OSA on CPAP No date: Thyroid disease  Past Surgical History: 09/15/2018: COLONOSCOPY WITH PROPOFOL; N/A     Comment:  Procedure: COLONOSCOPY WITH PROPOFOL;  Surgeon: Midge Minium, MD;  Location: [facility] ENDOSCOPY;  Service:               Endoscopy;  Laterality: N/A; No date: DILATION AND CURETTAGE OF UTERUS No date: LEEP No date: SALIVARY GLAND SURGERY     Comment:  left  No date: THYROIDECTOMY No date: TUBAL LIGATION  BMI    Body Mass Index: 31.83 kg/m      Reproductive/Obstetrics negative OB  ROS                             Anesthesia Physical Anesthesia Plan  ASA: 2  Anesthesia Plan: General   Post-op Pain Management: Minimal or no pain anticipated   Induction: Intravenous  PONV Risk Score and Plan: 3 and Propofol infusion, TIVA and Ondansetron  Airway Management Planned: Nasal Cannula  Additional Equipment: None  Intra-op Plan:   Post-operative Plan:   Informed Consent: I have reviewed the patients History and Physical, chart, labs and discussed the procedure including the risks, benefits and alternatives for the proposed anesthesia with the patient or authorized representative who has indicated his/her understanding and acceptance.     Dental advisory given  Plan Discussed with: CRNA and Surgeon  Anesthesia Plan Comments: (Discussed risks of anesthesia with patient, including possibility of difficulty with spontaneous ventilation under anesthesia necessitating airway intervention, PONV, and rare risks such as cardiac or respiratory or neurological events, and allergic reactions. Discussed the role of CRNA in patient's perioperative care. Patient understands.)       Anesthesia Quick Evaluation

## 2023-11-05 NOTE — Op Note (Signed)
[facility] Gastroenterology Patient Name: Kerry Davis Procedure Date: 11/05/2023 8:23 AM MRN: [id number] Account #: [id number] Date of Birth: 05-Apr-1966 Admit Type: Outpatient Age: 57 Room: [facility] ENDO ROOM 4 Gender: Female Note Status: Finalized Instrument Name: Prentice Docker [id number] Procedure:             Colonoscopy Indications:           High risk colon cancer surveillance: Personal history                         of colonic polyps Providers:             Midge Minium MD, MD Referring MD:          Midge Minium MD, MD (Referring MD), Bethanie Dicker                         (Referring MD) Medicines:             Propofol per Anesthesia Complications:         No immediate complications. Procedure:             Pre-Anesthesia Assessment:                        - Prior to the procedure, a History and Physical was                         performed, and patient medications and allergies were                         reviewed. The patient's tolerance of previous                         anesthesia was also reviewed. The risks and benefits                         of the procedure and the sedation options and risks                         were discussed with the patient. All questions were                         answered, and informed consent was obtained. Prior                         Anticoagulants: The patient has taken no anticoagulant                         or antiplatelet agents. ASA Grade Assessment: II - A                         patient with mild systemic disease. After reviewing                         the risks and benefits, the patient was deemed in                         satisfactory condition to undergo the procedure.  After obtaining informed consent, the colonoscope was                         passed under direct vision. Throughout the procedure,                         the patient's blood pressure, pulse, and oxygen                          saturations were monitored continuously. The                         Colonoscope was introduced through the anus and                         advanced to the the cecum, identified by appendiceal                         orifice and ileocecal valve. The colonoscopy was                         performed without difficulty. The patient tolerated                         the procedure well. The quality of the bowel                         preparation was excellent. Findings:      The perianal and digital rectal examinations were normal.      A 3 mm polyp was found in the transverse colon. The polyp was sessile.       The polyp was removed with a cold snare. Resection and retrieval were       complete.      A 3 mm polyp was found in the sigmoid colon. The polyp was sessile. The       polyp was removed with a cold snare. Resection and retrieval were       complete. Impression:            - One 3 mm polyp in the transverse colon, removed with                         a cold snare. Resected and retrieved.                        - One 3 mm polyp in the sigmoid colon, removed with a                         cold snare. Resected and retrieved. Recommendation:        - Discharge patient to home.                        - Resume previous diet.                        - Continue present medications.                        - Await pathology results.                        -  If the pathology report reveals adenomatous tissue,                         then repeat the colonoscopy for surveillance in 5                         years. Procedure Code(s):     --- Professional ---                        [id number], Colonoscopy, flexible; with removal of                         tumor(s), polyp(s), or other lesion(s) by snare                         technique Diagnosis Code(s):     --- Professional ---                        Z86.010, Personal history of colonic polyps                        D12.5, Benign neoplasm of  sigmoid colon CPT copyright 2022 American Medical Association. All rights reserved. The codes documented in this report are preliminary and upon coder review may  be revised to meet current compliance requirements. Midge Minium MD, MD 11/05/2023 9:01:43 AM This report has been signed electronically. Number of Addenda: 0 Note Initiated On: 11/05/2023 8:23 AM Scope Withdrawal Time: 0 hours 8 minutes 45 seconds  Total Procedure Duration: 0 hours 13 minutes 4 seconds  Estimated Blood Loss:  Estimated blood loss: none.      [facility]

## 2023-11-05 NOTE — H&P (Signed)
Midge Minium, MD [facility] [address], [address] [place], [place] [address] Phone:[phone] Fax : [phone]  Primary Care Physician:  Bethanie Dicker, NP Primary Gastroenterologist:  Dr. Servando Snare  Pre-Procedure History & Physical: HPI:  Kerry Davis is a 57 y.o. female is here for an colonoscopy.   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    Colon polyps    Depression    Diabetes mellitus without complication (HCC)    GERD (gastroesophageal reflux disease)    Herpes    1+2   Hyperlipidemia    Hypertension    OSA on CPAP    Thyroid disease     Past Surgical History:  Procedure Laterality Date   COLONOSCOPY WITH PROPOFOL N/A 09/15/2018   Procedure: COLONOSCOPY WITH PROPOFOL;  Surgeon: Midge Minium, MD;  Location: [facility] ENDOSCOPY;  Service: Endoscopy;  Laterality: N/A;   DILATION AND CURETTAGE OF UTERUS     LEEP     SALIVARY GLAND SURGERY     left    THYROIDECTOMY     TUBAL LIGATION      Prior to Admission medications   Medication Sig Start Date End Date Taking? Authorizing Provider  albuterol (VENTOLIN HFA) 108 (90 Base) MCG/ACT inhaler INHALE 1-2 PUFFS INTO THE LUNGS EVERY 4 HOURS AS NEEDED FOR WHEEZING OR SHORTNESS OF BREATH. 08/14/23  Yes Bethanie Dicker, NP  ALPRAZolam Prudy Feeler) 1 MG tablet Take 1 tablet (1 mg total) by mouth daily as needed. 08/13/23  Yes Bethanie Dicker, NP  atorvastatin (LIPITOR) 10 MG tablet Take 0.5 tablets (5 mg total) by mouth daily. 01/21/23  Yes Bethanie Dicker, NP  Blood Glucose Monitoring Suppl (GLUCOCOM BLOOD GLUCOSE MONITOR) DEVI Ok to fill per pt's insurance formulary 05/20/18  Yes [provider]  chlorpheniramine-HYDROcodone (TUSSIONEX) 10-8 MG/5ML Take 5 mLs by mouth at bedtime as needed for cough. 08/14/23  Yes Bethanie Dicker, NP  cyclobenzaprine (FLEXERIL) 5 MG tablet Take 1 tablet (5 mg total) by mouth at bedtime as needed for muscle  spasms. 08/14/23  Yes Bethanie Dicker, NP  Dulaglutide (TRULICITY) 4.5 MG/0.5ML SOPN Inject 4.5 mg into the skin once a week. 01/21/23  Yes Bethanie Dicker, NP  Dulaglutide (TRULICITY) 4.5 MG/0.5ML SOPN Please provide 4 PENS (8 mL) which is equal to a 30 day supply and 11  refills. 04/09/23  Yes   FREESTYLE LITE test strip  05/20/18  Yes [provider]  ipratropium (ATROVENT) 0.06 % nasal spray Place 2 sprays into both nostrils 3 (three) times daily. 12/25/21  Yes McLean-Scocuzza, Pasty Spillers, MD  Lancets (FREESTYLE) lancets  05/20/18  Yes [provider]  levocetirizine (XYZAL) 5 MG tablet Take 1 tablet (5 mg total) by mouth daily as needed for allergies. 01/21/23  Yes Bethanie Dicker, NP  levothyroxine (SYNTHROID) 137 MCG tablet Take 1 tablet (137 mcg total) by mouth daily. 01/21/23  Yes Bethanie Dicker, NP  levothyroxine (SYNTHROID) 137 MCG tablet Take 1 tablet (137 mcg total) by mouth daily. 04/09/23  Yes   losartan (COZAAR) 25 MG tablet Take 0.5 tablets (12.5 mg total) by mouth daily. 01/21/23  Yes Bethanie Dicker, NP  metFORMIN (GLUCOPHAGE-XR) 500 MG 24 hr tablet Take 2 tablets (1,000 mg total) by mouth 2 (two) times daily. 01/21/23  Yes Bethanie Dicker, NP  traZODone (DESYREL) 100 MG tablet Take 1 tablet (100 mg total) by mouth at bedtime.  01/21/23 01/21/24 Yes Bethanie Dicker, NP  triamterene-hydrochlorothiazide (MAXZIDE-25) 37.5-25 MG tablet Take 1 tablet by mouth daily. 01/21/23  Yes Bethanie Dicker, NP  omeprazole (PRILOSEC) 40 MG capsule TAKE 1 CAPSULE BY MOUTH DAILY 12/25/21 12/25/22  McLean-Scocuzza, Pasty Spillers, MD  propranolol ER (INDERAL LA) 60 MG 24 hr capsule Take 1 capsule (60 mg total) by mouth daily. Patient not taking: Reported on 08/14/2023 04/09/23       Allergies as of 10/02/2023 - Review Complete 08/14/2023  Allergen Reaction Noted   Propranolol hcl Cough 08/14/2023    Family History  Problem Relation Age of Onset   Cancer Mother        breast   Diabetes Mother    Heart disease Mother        CHF    Hypertension Mother    Hyperparathyroidism Mother    Breast cancer Mother 14   Diabetes Father    Heart disease Father        CHF   Hypertension Father    Thyroid nodules Daughter    Hypertension Son        ?   Stroke Maternal Grandmother    Cancer Maternal Grandmother        colon    Cancer Paternal Grandfather        thyroid   Diabetes Daughter        2    Social History   Socioeconomic History   Marital status: Legally Separated    Spouse name: Not on file   Number of children: Not on file   Years of education: Not on file   Highest education level: Not on file  Occupational History   Not on file  Tobacco Use   Smoking status: Some Days    Types: Cigarettes   Smokeless tobacco: Never   Tobacco comments:    1/2 ppd   Vaping Use   Vaping status: Never Used  Substance and Sexual Activity   Alcohol use: Yes    Comment: occ   Drug use: Not Currently   Sexual activity: Yes    Comment: men  Other Topics Concern   Not on file  Social History Narrative   3 kids (2 girls and 1 boy)   Charity fundraiser med surgery [facility]    Divorced now single    Social Drivers of Corporate investment banker Strain: Not on file  Food Insecurity: Not on file  Transportation Needs: Not on file  Physical Activity: Not on file  Stress: Not on file  Social Connections: Not on file  Intimate Partner Violence: Not on file    Review of Systems: See HPI, otherwise negative ROS  Physical Exam: BP (!) 159/96   Pulse 79   Temp (!) 96.7 F (35.9 C) (Temporal)   Resp 18   Ht 5' 10.5" (1.791 m)   Wt 102.1 kg   SpO2 100%   BMI 31.83 kg/m  General:   Alert,  pleasant and cooperative in NAD Head:  Normocephalic and atraumatic. Neck:  Supple; no masses or thyromegaly. Lungs:  Clear throughout to auscultation.    Heart:  Regular rate and rhythm. Abdomen:  Soft, nontender and nondistended. Normal bowel sounds, without guarding, and without rebound.   Neurologic:  Alert and  oriented x4;  grossly  normal neurologically.  Impression/Plan: Kerry Davis is here for an colonoscopy to be performed for a history of adenomatous polyps on 2019   Risks, benefits, limitations, and alternatives regarding  colonoscopy have been  reviewed with the patient.  Questions have been answered.  All parties agreeable.   Midge Minium, MD  11/05/2023, 8:37 AM

## 2023-11-05 NOTE — Anesthesia Procedure Notes (Signed)
Date/Time: 11/05/2023 8:40 AM  Performed by: Ginger Carne, CRNAPre-anesthesia Checklist: Patient identified, Emergency Drugs available, Suction available, Patient being monitored and Timeout performed Patient Re-evaluated:Patient Re-evaluated prior to induction Oxygen Delivery Method: Nasal cannula Preoxygenation: Pre-oxygenation with 100% oxygen Induction Type: IV induction Ventilation: Mask ventilation without difficulty

## 2023-11-06 ENCOUNTER — Encounter: Payer: Self-pay | Admitting: Gastroenterology

## 2023-11-06 ENCOUNTER — Other Ambulatory Visit: Payer: Self-pay

## 2023-11-06 LAB — SURGICAL PATHOLOGY

## 2023-11-10 ENCOUNTER — Other Ambulatory Visit: Payer: Self-pay

## 2023-11-11 ENCOUNTER — Other Ambulatory Visit: Payer: Self-pay

## 2023-11-11 MED FILL — Alprazolam Tab 1 MG: ORAL | 30 days supply | Qty: 30 | Fill #3 | Status: AC

## 2023-12-11 ENCOUNTER — Other Ambulatory Visit: Payer: Self-pay

## 2023-12-11 MED FILL — Alprazolam Tab 1 MG: ORAL | 30 days supply | Qty: 30 | Fill #4 | Status: AC

## 2023-12-16 ENCOUNTER — Telehealth: Payer: Self-pay

## 2023-12-16 ENCOUNTER — Encounter: Payer: Self-pay | Admitting: Nurse Practitioner

## 2023-12-16 NOTE — Telephone Encounter (Signed)
Please call pt and schedule annual exam.  Has health maintenance gaps that need closed.

## 2023-12-16 NOTE — Telephone Encounter (Signed)
Patient was called to schedule annual exam. Patient's father just passed away, she states she will be starting a new job next month and does not know her schedule. Patient said she would call back to schedule.

## 2024-01-09 ENCOUNTER — Other Ambulatory Visit: Payer: Self-pay

## 2024-01-09 MED FILL — Alprazolam Tab 1 MG: ORAL | 30 days supply | Qty: 30 | Fill #5 | Status: AC

## 2024-02-03 ENCOUNTER — Other Ambulatory Visit: Payer: Self-pay | Admitting: Nurse Practitioner

## 2024-02-03 ENCOUNTER — Other Ambulatory Visit: Payer: Self-pay

## 2024-02-03 DIAGNOSIS — F419 Anxiety disorder, unspecified: Secondary | ICD-10-CM

## 2024-02-03 DIAGNOSIS — I152 Hypertension secondary to endocrine disorders: Secondary | ICD-10-CM

## 2024-02-04 ENCOUNTER — Other Ambulatory Visit: Payer: Self-pay

## 2024-02-04 MED ORDER — TRIAMTERENE-HCTZ 37.5-25 MG PO TABS
1.0000 | ORAL_TABLET | Freq: Every day | ORAL | 0 refills | Status: DC
  Filled 2024-02-04: qty 30, 30d supply, fill #0

## 2024-02-04 MED ORDER — LOSARTAN POTASSIUM 25 MG PO TABS
12.5000 mg | ORAL_TABLET | Freq: Every day | ORAL | 0 refills | Status: DC
  Filled 2024-02-04: qty 45, 90d supply, fill #0

## 2024-02-04 MED ORDER — ALPRAZOLAM 1 MG PO TABS
1.0000 mg | ORAL_TABLET | Freq: Every day | ORAL | 0 refills | Status: DC | PRN
  Filled 2024-02-04 – 2024-02-06 (×2): qty 30, 30d supply, fill #0

## 2024-02-06 ENCOUNTER — Other Ambulatory Visit: Payer: Self-pay

## 2024-02-24 ENCOUNTER — Other Ambulatory Visit (HOSPITAL_COMMUNITY)
Admission: RE | Admit: 2024-02-24 | Discharge: 2024-02-24 | Disposition: A | Discharge status: Home / Self Care | Source: Ambulatory Visit | Attending: Nurse Practitioner | Admitting: Nurse Practitioner

## 2024-02-24 ENCOUNTER — Ambulatory Visit: Admitting: Nurse Practitioner

## 2024-02-24 ENCOUNTER — Encounter: Payer: Self-pay | Admitting: Nurse Practitioner

## 2024-02-24 VITALS — BP 120/70 | HR 92 | Temp 97.9°F | Ht 70.5 in | Wt 224.6 lb

## 2024-02-24 DIAGNOSIS — E890 Postprocedural hypothyroidism: Secondary | ICD-10-CM

## 2024-02-24 DIAGNOSIS — Z7985 Long-term (current) use of injectable non-insulin antidiabetic drugs: Secondary | ICD-10-CM | POA: Diagnosis not present

## 2024-02-24 DIAGNOSIS — E785 Hyperlipidemia, unspecified: Secondary | ICD-10-CM

## 2024-02-24 DIAGNOSIS — F419 Anxiety disorder, unspecified: Secondary | ICD-10-CM

## 2024-02-24 DIAGNOSIS — I152 Hypertension secondary to endocrine disorders: Secondary | ICD-10-CM | POA: Diagnosis not present

## 2024-02-24 DIAGNOSIS — F32A Depression, unspecified: Secondary | ICD-10-CM

## 2024-02-24 DIAGNOSIS — Z113 Encounter for screening for infections with a predominantly sexual mode of transmission: Secondary | ICD-10-CM | POA: Insufficient documentation

## 2024-02-24 DIAGNOSIS — C73 Malignant neoplasm of thyroid gland: Secondary | ICD-10-CM | POA: Diagnosis not present

## 2024-02-24 DIAGNOSIS — E1159 Type 2 diabetes mellitus with other circulatory complications: Secondary | ICD-10-CM | POA: Diagnosis not present

## 2024-02-24 DIAGNOSIS — Z01419 Encounter for gynecological examination (general) (routine) without abnormal findings: Secondary | ICD-10-CM | POA: Diagnosis not present

## 2024-02-24 DIAGNOSIS — M5442 Lumbago with sciatica, left side: Secondary | ICD-10-CM

## 2024-02-24 DIAGNOSIS — Z0000 Encounter for general adult medical examination without abnormal findings: Secondary | ICD-10-CM | POA: Diagnosis not present

## 2024-02-24 DIAGNOSIS — Z1231 Encounter for screening mammogram for malignant neoplasm of breast: Secondary | ICD-10-CM

## 2024-02-24 DIAGNOSIS — E119 Type 2 diabetes mellitus without complications: Secondary | ICD-10-CM

## 2024-02-24 DIAGNOSIS — G4700 Insomnia, unspecified: Secondary | ICD-10-CM

## 2024-02-24 DIAGNOSIS — G8929 Other chronic pain: Secondary | ICD-10-CM

## 2024-02-24 MED ORDER — CYCLOBENZAPRINE HCL 5 MG PO TABS: 5.0000 mg | ORAL_TABLET | Freq: Every evening | ORAL | 3 refills | Status: AC | PRN

## 2024-02-24 MED ORDER — TRIAMTERENE-HCTZ 37.5-25 MG PO TABS: 1.0000 | ORAL_TABLET | Freq: Every day | ORAL | 3 refills | Status: DC

## 2024-02-24 MED ORDER — ALPRAZOLAM 1 MG PO TABS: 1.0000 mg | ORAL_TABLET | Freq: Two times a day (BID) | ORAL | 5 refills | Status: DC | PRN

## 2024-02-24 MED ORDER — TRAZODONE HCL 100 MG PO TABS: 100.0000 mg | ORAL_TABLET | Freq: Every day | ORAL | 3 refills | Status: AC

## 2024-02-24 MED ORDER — ATORVASTATIN CALCIUM 10 MG PO TABS: 10.0000 mg | ORAL_TABLET | Freq: Every day | ORAL | 3 refills | Status: DC

## 2024-02-24 MED ORDER — TRULICITY 4.5 MG/0.5ML ~~LOC~~ SOAJ: 4.5000 mg | SUBCUTANEOUS | 3 refills | Status: AC

## 2024-02-24 MED ORDER — LOSARTAN POTASSIUM 25 MG PO TABS: 12.5000 mg | ORAL_TABLET | Freq: Every day | ORAL | 3 refills | Status: DC

## 2024-02-24 NOTE — Patient Instructions (Signed)
 YOUR MAMMOGRAM IS DUE, PLEASE CALL AND GET THIS SCHEDULED! [facility] - call [phone]

## 2024-02-24 NOTE — Progress Notes (Signed)
 Kerry Burkitt, NP-C Phone: [phone]  Kerry Davis is a 58 y.o. female who presents today for annual exam.   Discussed the use of AI scribe software for clinical note transcription with the patient, who gave verbal consent to proceed.  History of Present Illness   Kerry Davis is a 58 year old female who presents for annual physical.   She experiences significant anxiety and insomnia, which she attributes to current life stressors, including the recent passing of her father and the responsibility of caring for her mother. She has been on Xanax since 2011, taking it once daily, usually after work, but feels that increasing the dose to twice daily might help, as she has tried it once with positive results. She has not been taking her trazodone due to running out of the medication, which may be contributing to her sleep issues.  She is currently taking losartan, triamterene/hydrochlorothiazide, Trulicity, metformin, Lipitor, and levothyroxine regularly. She was previously prescribed propranolol but cannot take it and has not informed her endocrinologist. She discontinued Effexor a couple of years ago as it was ineffective for her hot flashes.  No chest pain, shortness of breath, abdominal pain, or significant gastrointestinal issues. She reports occasional palpitations and a headache two days ago but does not typically suffer from headaches. She experiences vaginal dryness and occasional bleeding, which she attributes to the dryness. She also reports itching, likely due to eczema, which she manages with Aveeno and hydrocortisone cream. She has a history of asthma and allergies.  Her family history includes breast cancer in her mother and great-grandmother, and colon cancer, which prompted her recent colonoscopy.  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 She smokes less than half a pack of cigarettes daily, does not  consume alcohol or drugs, and maintains an active lifestyle due to her job. She reports weight loss due to stress and a decreased appetite, often skipping meals and snacking on chips and diet drinks at work.      Social History   Tobacco Use  Smoking Status Some Days   Types: Cigarettes  Smokeless Tobacco Never  Tobacco Comments   1/2 ppd     Current Outpatient Medications on File Prior to Visit  Medication Sig Dispense Refill   albuterol (VENTOLIN HFA) 108 (90 Base) MCG/ACT inhaler INHALE 1-2 PUFFS INTO THE LUNGS EVERY 4 HOURS AS NEEDED FOR WHEEZING OR SHORTNESS OF BREATH. 8.5 g 0   Blood Glucose Monitoring Suppl (GLUCOCOM BLOOD GLUCOSE MONITOR) DEVI Ok to fill per pt's insurance formulary     FREESTYLE LITE test strip   3   Lancets (FREESTYLE) lancets   3   levocetirizine (XYZAL) 5 MG tablet Take 1 tablet (5 mg total) by mouth daily as needed for allergies. 90 tablet 3   levothyroxine (SYNTHROID) 137 MCG tablet Take 1 tablet (137 mcg total) by mouth daily. 90 tablet 3   metFORMIN (GLUCOPHAGE-XR) 500 MG 24 hr tablet Take 2 tablets (1,000 mg total) by mouth 2 (two) times daily. 360 tablet 1   omeprazole (PRILOSEC) 40 MG capsule TAKE 1 CAPSULE BY MOUTH DAILY 90 capsule 3   No current facility-administered medications on file prior to visit.     ROS see history of present illness  Objective  Physical Exam Vitals:   02/24/24 1414  BP: 120/70  Pulse: 92  Temp: 97.9 F (36.6 C)  SpO2: 98%    BP Readings from Last 3 Encounters:  02/24/24 120/70  11/05/23 105/70  08/14/23 100/80   Wt Readings from Last 3 Encounters:  02/24/24 224 lb 9.6 oz (101.9 kg)  11/05/23 225 lb (102.1 kg)  08/14/23 232 lb 3.2 oz (105.3 kg)    Physical Exam Exam conducted with a chaperone present Kerry Davis, CMA).  Constitutional:      General: She is not in acute distress.    Appearance: Normal appearance.  HENT:     Head: Normocephalic.     Right Ear: Tympanic membrane normal.      Left Ear: Tympanic membrane normal.     Nose: Nose normal.     Mouth/Throat:     Mouth: Mucous membranes are moist.     Pharynx: Oropharynx is clear.  Eyes:     Conjunctiva/sclera: Conjunctivae normal.     Pupils: Pupils are equal, round, and reactive to light.  Neck:     Thyroid: No thyromegaly.  Cardiovascular:     Rate and Rhythm: Normal rate and regular rhythm.     Heart sounds: Normal heart sounds.  Pulmonary:     Effort: Pulmonary effort is normal.     Breath sounds: Normal breath sounds.  Abdominal:     General: Abdomen is flat. Bowel sounds are normal.     Palpations: Abdomen is soft. There is no mass.     Tenderness: There is no abdominal tenderness.  Genitourinary:    Pubic Area: No rash.      Labia:        Right: No rash or lesion.        Left: No rash or lesion.      Vagina: Normal. No vaginal discharge or bleeding.     Cervix: Normal. No discharge or erythema.     Uterus: Normal.   Musculoskeletal:        General: Normal range of motion.  Lymphadenopathy:     Cervical: No cervical adenopathy.  Skin:    General: Skin is warm and dry.     Findings: No rash.  Neurological:     General: No focal deficit present.     Mental Status: She is alert.  Psychiatric:        Mood and Affect: Mood normal.        Behavior: Behavior normal.     Assessment/Plan: Please see individual problem list.  Well woman exam with routine gynecological exam Assessment & Plan: Physical exam complete. Lab work as outlined. Colonoscopy is up to date. She is due for a Pap smear and mammogram. Family history of breast cancer necessitates regular screening. Flu and tetanus vaccines are current. She has completed the shingles vaccine series and declines additional COVID vaccines. Perform Pap smear today and order mammogram. Counseled on tobacco cessation. Continue routine dental and eye exams. Encourage healthy diet and regular exercise. Return to care in 6 months, sooner as needed.    Orders: -     Cytology - PAP  Anxiety and depression Assessment & Plan: Xanax provides rapid anxiety relief but lacks long-term efficacy. She attends therapy for additional support. Increase Xanax to twice daily and refill trazodone prescription. PDMP reviewed.   Orders: -     ALPRAZolam; Take 1 tablet (1 mg total) by mouth 2 (two) times daily as needed for anxiety.  Dispense: 60 tablet; Refill: 5  Insomnia, unspecified type Assessment & Plan: She experiences breakthrough insomnia. Xanax provides rapid anxiety relief but lacks long-term efficacy. She attends therapy for additional support. Continue Xanax at bedtime and refill  trazodone prescription.  Orders: -     traZODone HCl; Take 1 tablet (100 mg total) by mouth at bedtime.  Dispense: 90 tablet; Refill: 3  Type 2 diabetes mellitus without complication, without long-term current use of insulin (HCC) Assessment & Plan: Her diabetes is managed with Trulicity and metformin. She reports weight loss due to stress and reduced appetite. Check A1c and perform kidney function tests, including microalbumin/creatinine ratio. Continue current medication regimen. Encourage healthy diet and regular exercise.   Orders: -     Trulicity; Inject 4.5 mg into the skin once a week.  Dispense: 6 mL; Refill: 3 -     Hemoglobin A1c -     Microalbumin / creatinine urine ratio  Papillary adenocarcinoma, follicular variant (HCC) Assessment & Plan: Managed by [facility]. Follow up as scheduled.    Postoperative hypothyroidism Assessment & Plan: Managed with Levothyroxine 137 mcg daily. Continue. Follow up with Endocrinology as scheduled.    Hypertension associated with diabetes (HCC) Assessment & Plan: Her blood pressure is well-controlled with losartan and triamterene/hydrochlorothiazide. Continue.   Orders: -     Losartan Potassium; Take 0.5 tablets (12.5 mg total) by mouth daily.  Dispense: 45 tablet; Refill: 3 -     Triamterene-HCTZ;  Take 1 tablet by mouth daily.  Dispense: 90 tablet; Refill: 3 -     CBC with Differential/Platelet -     Comprehensive metabolic panel with GFR  Hyperlipidemia, unspecified hyperlipidemia type Assessment & Plan: Cholesterol is managed with Lipitor 10 mg daily. Continue.   Orders: -     Atorvastatin Calcium; Take 1 tablet (10 mg total) by mouth daily.  Dispense: 90 tablet; Refill: 3  Chronic midline low back pain with left-sided sciatica -     Cyclobenzaprine HCl; Take 1 tablet (5 mg total) by mouth at bedtime as needed for muscle spasms.  Dispense: 90 tablet; Refill: 3  Screening mammogram for breast cancer -     3D Screening Mammogram, Left and Right; Future    Return in about 6 months (around 08/25/2024) for Follow up.   Kerry Burkitt, NP-C [facility] - [facility]

## 2024-02-25 LAB — CBC WITH DIFFERENTIAL/PLATELET
Basophils Absolute: 0.1 10*3/uL (ref 0.0–0.1)
Basophils Relative: 1.1 % (ref 0.0–3.0)
Eosinophils Absolute: 0.1 10*3/uL (ref 0.0–0.7)
Eosinophils Relative: 1.2 % (ref 0.0–5.0)
HCT: 41.4 % (ref 36.0–46.0)
Hemoglobin: 13.9 g/dL (ref 12.0–15.0)
Lymphocytes Relative: 38.6 % (ref 12.0–46.0)
Lymphs Abs: 2.1 10*3/uL (ref 0.7–4.0)
MCHC: 33.6 g/dL (ref 30.0–36.0)
MCV: 85.7 fl (ref 78.0–100.0)
Monocytes Absolute: 0.3 10*3/uL (ref 0.1–1.0)
Monocytes Relative: 5.1 % (ref 3.0–12.0)
Neutro Abs: 3 10*3/uL (ref 1.4–7.7)
Neutrophils Relative %: 54 % (ref 43.0–77.0)
Platelets: 332 10*3/uL (ref 150.0–400.0)
RBC: 4.84 Mil/uL (ref 3.87–5.11)
RDW: 14 % (ref 11.5–15.5)
WBC: 5.6 10*3/uL (ref 4.0–10.5)

## 2024-02-25 LAB — COMPREHENSIVE METABOLIC PANEL WITH GFR
ALT: 17 U/L (ref 0–35)
AST: 14 U/L (ref 0–37)
Albumin: 4.7 g/dL (ref 3.5–5.2)
Alkaline Phosphatase: 67 U/L (ref 39–117)
BUN: 13 mg/dL (ref 6–23)
CO2: 26 meq/L (ref 19–32)
Calcium: 9.7 mg/dL (ref 8.4–10.5)
Chloride: 102 meq/L (ref 96–112)
Creatinine, Ser: 1.06 mg/dL (ref 0.40–1.20)
GFR: 58.55 mL/min — ABNORMAL LOW (ref >60.00)
Glucose, Bld: 104 mg/dL — ABNORMAL HIGH (ref 70–99)
Potassium: 3.5 meq/L (ref 3.5–5.1)
Sodium: 139 meq/L (ref 135–145)
Total Bilirubin: 0.4 mg/dL (ref 0.2–1.2)
Total Protein: 7.7 g/dL (ref 6.0–8.3)

## 2024-02-25 LAB — MICROALBUMIN / CREATININE URINE RATIO
Creatinine,U: 43.8 mg/dL
Microalb Creat Ratio: UNDETERMINED mg/g (ref 0.0–30.0)
Microalb, Ur: <0.7 mg/dL

## 2024-02-25 LAB — HEMOGLOBIN A1C: Hgb A1c MFr Bld: 6.7 % — ABNORMAL HIGH (ref 4.6–6.5)

## 2024-02-26 ENCOUNTER — Encounter: Payer: Self-pay | Admitting: Nurse Practitioner

## 2024-02-26 ENCOUNTER — Other Ambulatory Visit: Payer: Self-pay

## 2024-02-26 DIAGNOSIS — R899 Unspecified abnormal finding in specimens from other organs, systems and tissues: Secondary | ICD-10-CM

## 2024-02-26 LAB — CYTOLOGY - PAP
Chlamydia: NEGATIVE
Comment: NEGATIVE
Comment: NEGATIVE
Comment: NEGATIVE
Comment: NORMAL
Diagnosis: NEGATIVE
High risk HPV: NEGATIVE
Neisseria Gonorrhea: NEGATIVE
Trichomonas: NEGATIVE

## 2024-03-01 ENCOUNTER — Telehealth: Payer: Self-pay

## 2024-03-01 NOTE — Telephone Encounter (Signed)
 In pts lov provider wanted pt scheduled a 6 month follow up   Return in about 6 months (around 08/25/2024) for Follow up.     Bluford Burkitt, NP-C    Mychart msg sent informing pt to schedule

## 2024-03-02 ENCOUNTER — Encounter: Payer: Self-pay | Admitting: Nurse Practitioner

## 2024-03-02 NOTE — Assessment & Plan Note (Signed)
 Cholesterol is managed with Lipitor 10 mg daily. Continue.

## 2024-03-02 NOTE — Assessment & Plan Note (Signed)
 Managed by [facility] Endocrinology. Follow up as scheduled.

## 2024-03-02 NOTE — Assessment & Plan Note (Signed)
 Physical exam complete. Lab work as outlined. Colonoscopy is up to date. She is due for a Pap smear and mammogram. Family history of breast cancer necessitates regular screening. Flu and tetanus vaccines are current. She has completed the shingles vaccine series and declines additional COVID vaccines. Perform Pap smear today and order mammogram. Counseled on tobacco cessation. Continue routine dental and eye exams. Encourage healthy diet and regular exercise. Return to care in 6 months, sooner as needed.

## 2024-03-02 NOTE — Assessment & Plan Note (Signed)
 Her diabetes is managed with Trulicity and metformin. She reports weight loss due to stress and reduced appetite. Check A1c and perform kidney function tests, including microalbumin/creatinine ratio. Continue current medication regimen. Encourage healthy diet and regular exercise.

## 2024-03-02 NOTE — Assessment & Plan Note (Signed)
 She experiences breakthrough insomnia. Xanax provides rapid anxiety relief but lacks long-term efficacy. She attends therapy for additional support. Continue Xanax at bedtime and refill trazodone prescription.

## 2024-03-02 NOTE — Assessment & Plan Note (Signed)
 Xanax provides rapid anxiety relief but lacks long-term efficacy. She attends therapy for additional support. Increase Xanax to twice daily and refill trazodone prescription. PDMP reviewed.

## 2024-03-02 NOTE — Assessment & Plan Note (Signed)
 Her blood pressure is well-controlled with losartan and triamterene/hydrochlorothiazide. Continue.

## 2024-03-02 NOTE — Assessment & Plan Note (Signed)
 Managed with Levothyroxine 137 mcg daily. Continue. Follow up with Endocrinology as scheduled.

## 2024-09-11 ENCOUNTER — Other Ambulatory Visit: Payer: Self-pay | Admitting: Nurse Practitioner

## 2024-09-11 DIAGNOSIS — F419 Anxiety disorder, unspecified: Secondary | ICD-10-CM

## 2024-10-05 ENCOUNTER — Ambulatory Visit: Admitting: Nurse Practitioner

## 2024-10-05 ENCOUNTER — Encounter: Payer: Self-pay | Admitting: Nurse Practitioner

## 2024-10-05 VITALS — BP 120/72 | HR 83 | Temp 97.6°F | Ht 70.5 in | Wt 224.0 lb

## 2024-10-05 DIAGNOSIS — E1159 Type 2 diabetes mellitus with other circulatory complications: Secondary | ICD-10-CM

## 2024-10-05 DIAGNOSIS — E890 Postprocedural hypothyroidism: Secondary | ICD-10-CM

## 2024-10-05 DIAGNOSIS — J4520 Mild intermittent asthma, uncomplicated: Secondary | ICD-10-CM

## 2024-10-05 DIAGNOSIS — E785 Hyperlipidemia, unspecified: Secondary | ICD-10-CM | POA: Diagnosis not present

## 2024-10-05 DIAGNOSIS — Z7984 Long term (current) use of oral hypoglycemic drugs: Secondary | ICD-10-CM

## 2024-10-05 DIAGNOSIS — E119 Type 2 diabetes mellitus without complications: Secondary | ICD-10-CM

## 2024-10-05 DIAGNOSIS — F32A Depression, unspecified: Secondary | ICD-10-CM | POA: Diagnosis not present

## 2024-10-05 DIAGNOSIS — I152 Hypertension secondary to endocrine disorders: Secondary | ICD-10-CM

## 2024-10-05 DIAGNOSIS — F419 Anxiety disorder, unspecified: Secondary | ICD-10-CM | POA: Diagnosis not present

## 2024-10-05 DIAGNOSIS — J309 Allergic rhinitis, unspecified: Secondary | ICD-10-CM

## 2024-10-05 LAB — COMPREHENSIVE METABOLIC PANEL WITH GFR
ALT: 29 U/L (ref 0–35)
AST: 16 U/L (ref 0–37)
Albumin: 4.2 g/dL (ref 3.5–5.2)
Alkaline Phosphatase: 59 U/L (ref 39–117)
BUN: 16 mg/dL (ref 6–23)
CO2: 27 meq/L (ref 19–32)
Calcium: 9 mg/dL (ref 8.4–10.5)
Chloride: 105 meq/L (ref 96–112)
Creatinine, Ser: 0.98 mg/dL (ref 0.40–1.20)
GFR: 64.06 mL/min (ref >60.00)
Glucose, Bld: 86 mg/dL (ref 70–99)
Potassium: 4 meq/L (ref 3.5–5.1)
Sodium: 140 meq/L (ref 135–145)
Total Bilirubin: 0.3 mg/dL (ref 0.2–1.2)
Total Protein: 6.9 g/dL (ref 6.0–8.3)

## 2024-10-05 LAB — TSH: TSH: 0.04 u[IU]/mL — ABNORMAL LOW (ref 0.35–5.50)

## 2024-10-05 LAB — HEMOGLOBIN A1C: Hgb A1c MFr Bld: 6.4 % (ref 4.6–6.5)

## 2024-10-05 MED ORDER — EZETIMIBE 10 MG PO TABS: 10.0000 mg | ORAL_TABLET | Freq: Every day | ORAL | 3 refills | Status: AC

## 2024-10-05 MED ORDER — ALPRAZOLAM 1 MG PO TABS: 1.0000 mg | ORAL_TABLET | Freq: Two times a day (BID) | ORAL | 5 refills | Status: AC | PRN

## 2024-10-05 MED ORDER — ALBUTEROL SULFATE HFA 108 (90 BASE) MCG/ACT IN AERS: INHALATION_SPRAY | RESPIRATORY_TRACT | 2 refills | Status: AC

## 2024-10-05 MED ORDER — LOSARTAN POTASSIUM 25 MG PO TABS: 12.5000 mg | ORAL_TABLET | Freq: Every day | ORAL | 3 refills | Status: AC

## 2024-10-05 MED ORDER — TRIAMTERENE-HCTZ 37.5-25 MG PO TABS: 1.0000 | ORAL_TABLET | Freq: Every day | ORAL | 3 refills | Status: AC

## 2024-10-05 MED ORDER — LEVOTHYROXINE SODIUM 137 MCG PO TABS: 137.0000 ug | ORAL_TABLET | Freq: Every day | ORAL | 3 refills | Status: DC

## 2024-10-05 MED ORDER — LEVOCETIRIZINE DIHYDROCHLORIDE 5 MG PO TABS: 5.0000 mg | ORAL_TABLET | Freq: Every day | ORAL | 3 refills | Status: AC | PRN

## 2024-10-05 MED ORDER — METFORMIN HCL ER 500 MG PO TB24: 1000.0000 mg | ORAL_TABLET | Freq: Two times a day (BID) | ORAL | 3 refills | Status: AC

## 2024-10-05 NOTE — Telephone Encounter (Signed)
 open in error

## 2024-10-05 NOTE — Progress Notes (Signed)
 Kerry Glance, NP-C Phone: [phone]  Kerry Davis is a 58 y.o. female who presents today for follow up.   Discussed the use of AI scribe software for clinical note transcription with the patient, who gave verbal consent to proceed.  History of Present Illness   Kerry Davis is a 58 year old female who presents for follow up.   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 She is currently on levothyroxine  and requests a refill, stating that her medication has remained stable as long as her thyroid  levels are within normal range. No heart palpitations, but she notes dry skin and temperature changes attributed to menopause.  She has a history of diabetes and is currently taking Trulicity  and metformin . Her A1c previously increased from 6.3 to 6.7 due to a reduction in her Trulicity  dose, which was adjusted because of supply issues. She does not regularly check her blood sugar but denies symptoms of excessive thirst, urination, or hypoglycemia.  She reports a decrease in her GFR from the 70s to 58. She has a family history of kidney problems, as her father had kidney issues. She works night shifts and admits to poor hydration habits.  She recently stopped taking Lipitor due to muscle aches and cramping, which have improved since discontinuation. She is considering alternative cholesterol-lowering options.  She experiences allergies and has restarted Xyzal . She reports a cough and wheezing, for which she has an albuterol  inhaler but has not used it yet.      Social History   Tobacco Use  Smoking Status Some Days   Types: Cigarettes  Smokeless Tobacco Never  Tobacco Comments   1/2 ppd     Current Outpatient Medications on File Prior to Visit  Medication Sig Dispense Refill   Blood Glucose Monitoring Suppl (GLUCOCOM BLOOD GLUCOSE MONITOR) DEVI Ok to fill per pt's insurance formulary      cyclobenzaprine  (FLEXERIL ) 5 MG tablet Take 1 tablet (5 mg total) by mouth at bedtime as needed for muscle spasms. 90 tablet 3   Dulaglutide  (TRULICITY ) 4.5 MG/0.5ML SOAJ Inject 4.5 mg into the skin once a week. 6 mL 3   FREESTYLE LITE test strip   3   Lancets (FREESTYLE) lancets   3   traZODone  (DESYREL ) 100 MG tablet Take 1 tablet (100 mg total) by mouth at bedtime. 90 tablet 3   No current facility-administered medications on file prior to visit.     ROS see history of present illness  Objective  Physical Exam Vitals:   10/05/24 1110  BP: 120/72  Pulse: 83  Temp: 97.6 F (36.4 C)  SpO2: 98%    BP Readings from Last 3 Encounters:  10/05/24 120/72  02/24/24 120/70  11/05/23 105/70   Wt Readings from Last 3 Encounters:  10/05/24 224 lb (101.6 kg)  02/24/24 224 lb 9.6 oz (101.9 kg)  11/05/23 225 lb (102.1 kg)    Physical Exam Constitutional:      General: She is not in acute distress.    Appearance: Normal appearance.  HENT:     Head: Normocephalic.  Cardiovascular:     Rate and Rhythm: Normal rate and regular rhythm.     Heart sounds: Normal heart sounds.  Pulmonary:     Effort: Pulmonary effort is normal.     Breath sounds: Normal breath sounds.  Skin:    General: Skin is warm and dry.  Neurological:  General: No focal deficit present.     Mental Status: She is alert.  Psychiatric:        Mood and Affect: Mood normal.        Behavior: Behavior normal.      Assessment/Plan: Please see individual problem list.  Type 2 diabetes mellitus without complication, without long-term current use of insulin  (HCC) Assessment & Plan: A1c increased from 6.3 to 6.7 due to decreased Trulicity  dosage, but blood sugar remains well-controlled. Check A1c today. Continue Trulicity  and metformin . Encourage healthy diet and regular exercise.   Orders: -     metFORMIN  HCl ER; Take 2 tablets (1,000 mg total) by mouth 2 (two) times daily with a meal.  Dispense: 360 tablet;  Refill: 3 -     Hemoglobin A1c  Anxiety and depression Assessment & Plan: Well-managed on Xanax  with good symptom control. She attends therapy for additional support. Continue Xanax  twice daily as needed. PDMP reviewed.   Orders: -     ALPRAZolam ; Take 1 tablet (1 mg total) by mouth 2 (two) times daily as needed for anxiety.  Dispense: 60 tablet; Refill: 5  Hypertension associated with diabetes (HCC) Assessment & Plan: Her blood pressure is well-controlled with losartan  and triamterene /hydrochlorothiazide . Continue.   Orders: -     Comprehensive metabolic panel with GFR -     Losartan  Potassium; Take 0.5 tablets (12.5 mg total) by mouth daily.  Dispense: 45 tablet; Refill: 3 -     Triamterene -HCTZ; Take 1 tablet by mouth daily.  Dispense: 90 tablet; Refill: 3  Hyperlipidemia, unspecified hyperlipidemia type Assessment & Plan: Intolerance to Lipitor due to muscle aches. Discussed alternatives and started Zetia  10 mg daily. Consider CoQ10 and Colest Off supplements. Encourage healthy diet and regular exercise. We will continue to monitor.   Orders: -     Comprehensive metabolic panel with GFR -     Ezetimibe ; Take 1 tablet (10 mg total) by mouth daily.  Dispense: 90 tablet; Refill: 3  Postoperative hypothyroidism Assessment & Plan: Managed with Levothyroxine  137 mcg daily. Continue. Stable TSH levels. Previously managed by Endocrinology she prefers management by primary care provider, we will plan to take over medication and monitoring.   Orders: -     TSH  Mild intermittent asthma without complication Assessment & Plan: Recent asthma exacerbation with cough and wheezing, possibly allergy-related. Resumed Xyzal . Use albuterol  inhaler as needed. Continue Xyzal  for allergy management and consider Mucinex for congestion.  Orders: -     Albuterol  Sulfate HFA; INHALE 1-2 PUFFS INTO THE LUNGS EVERY 4 HOURS AS NEEDED FOR WHEEZING OR SHORTNESS OF BREATH.  Dispense: 8.5 g; Refill:  2  Allergic rhinitis, unspecified seasonality, unspecified trigger -     Levocetirizine Dihydrochloride ; Take 1 tablet (5 mg total) by mouth daily as needed for allergies.  Dispense: 90 tablet; Refill: 3     Return in about 6 months (around 04/04/2025) for Follow up.   Kerry Glance, NP-C [facility] [place] [facility] - [facility]

## 2024-10-06 ENCOUNTER — Ambulatory Visit: Payer: Self-pay | Admitting: Nurse Practitioner

## 2024-10-06 DIAGNOSIS — E890 Postprocedural hypothyroidism: Secondary | ICD-10-CM

## 2024-10-06 MED ORDER — LEVOTHYROXINE SODIUM 125 MCG PO TABS: 125.0000 ug | ORAL_TABLET | Freq: Every day | ORAL | 0 refills | Status: AC

## 2024-10-19 ENCOUNTER — Encounter: Payer: Self-pay | Admitting: Nurse Practitioner

## 2024-10-19 NOTE — Assessment & Plan Note (Signed)
 Managed with Levothyroxine  137 mcg daily. Continue. Stable TSH levels. Previously managed by Endocrinology she prefers management by primary care provider, we will plan to take over medication and monitoring.

## 2024-10-19 NOTE — Assessment & Plan Note (Signed)
 Well-managed on Xanax  with good symptom control. She attends therapy for additional support. Continue Xanax  twice daily as needed. PDMP reviewed.

## 2024-10-19 NOTE — Assessment & Plan Note (Signed)
 Her blood pressure is well-controlled with losartan and triamterene/hydrochlorothiazide. Continue.

## 2024-10-19 NOTE — Assessment & Plan Note (Signed)
 Recent asthma exacerbation with cough and wheezing, possibly allergy-related. Resumed Xyzal . Use albuterol  inhaler as needed. Continue Xyzal  for allergy management and consider Mucinex for congestion.

## 2024-10-19 NOTE — Assessment & Plan Note (Signed)
 A1c increased from 6.3 to 6.7 due to decreased Trulicity  dosage, but blood sugar remains well-controlled. Check A1c today. Continue Trulicity  and metformin . Encourage healthy diet and regular exercise.

## 2024-10-19 NOTE — Assessment & Plan Note (Signed)
 Intolerance to Lipitor due to muscle aches. Discussed alternatives and started Zetia  10 mg daily. Consider CoQ10 and Colest Off supplements. Encourage healthy diet and regular exercise. We will continue to monitor.

## 2024-11-17 ENCOUNTER — Other Ambulatory Visit

## 2025-04-06 ENCOUNTER — Ambulatory Visit: Admitting: Nurse Practitioner
